# Patient Record
Sex: Female | Born: 1970 | Race: White | Hispanic: No | Smoking: Never smoker
Health system: Southern US, Community
[De-identification: ages and names within clinical notes are randomized; demographics above are authoritative.]

## PROBLEM LIST (undated history)

## (undated) DIAGNOSIS — G479 Sleep disorder, unspecified: Secondary | ICD-10-CM

## (undated) DIAGNOSIS — F418 Other specified anxiety disorders: Secondary | ICD-10-CM

## (undated) DIAGNOSIS — E559 Vitamin D deficiency, unspecified: Secondary | ICD-10-CM

## (undated) DIAGNOSIS — Z8489 Family history of other specified conditions: Secondary | ICD-10-CM

## (undated) DIAGNOSIS — K219 Gastro-esophageal reflux disease without esophagitis: Secondary | ICD-10-CM

## (undated) DIAGNOSIS — J069 Acute upper respiratory infection, unspecified: Secondary | ICD-10-CM

## (undated) DIAGNOSIS — J3489 Other specified disorders of nose and nasal sinuses: Secondary | ICD-10-CM

## (undated) DIAGNOSIS — W57XXXA Bitten or stung by nonvenomous insect and other nonvenomous arthropods, initial encounter: Secondary | ICD-10-CM

## (undated) DIAGNOSIS — R4589 Other symptoms and signs involving emotional state: Secondary | ICD-10-CM

## (undated) DIAGNOSIS — R002 Palpitations: Secondary | ICD-10-CM

## (undated) HISTORY — DX: Gastro-esophageal reflux disease without esophagitis: K21.9

## (undated) HISTORY — PX: COLONOSCOPY: SHX174

## (undated) HISTORY — DX: Sleep disorder, unspecified: G47.9

## (undated) HISTORY — DX: Other symptoms and signs involving emotional state: R45.89

## (undated) HISTORY — PX: WISDOM TOOTH EXTRACTION: SHX21

## (undated) HISTORY — DX: Bitten or stung by nonvenomous insect and other nonvenomous arthropods, initial encounter: W57.XXXA

## (undated) HISTORY — DX: Acute upper respiratory infection, unspecified: J06.9

## (undated) HISTORY — DX: Vitamin D deficiency, unspecified: E55.9

## (undated) HISTORY — DX: Other specified disorders of nose and nasal sinuses: J34.89

## (undated) HISTORY — DX: Palpitations: R00.2

## (undated) HISTORY — DX: Other specified anxiety disorders: F41.8

---

## 1999-01-07 ENCOUNTER — Inpatient Hospital Stay (HOSPITAL_COMMUNITY): Admission: AD | Admit: 1999-01-07 | Discharge: 1999-01-09 | Payer: Self-pay | Admitting: Obstetrics and Gynecology

## 2000-02-11 ENCOUNTER — Other Ambulatory Visit: Admission: RE | Admit: 2000-02-11 | Discharge: 2000-02-11 | Payer: Self-pay | Admitting: Obstetrics and Gynecology

## 2001-04-24 ENCOUNTER — Other Ambulatory Visit: Admission: RE | Admit: 2001-04-24 | Discharge: 2001-04-24 | Payer: Self-pay | Admitting: Obstetrics and Gynecology

## 2002-04-25 ENCOUNTER — Other Ambulatory Visit: Admission: RE | Admit: 2002-04-25 | Discharge: 2002-04-25 | Payer: Self-pay | Admitting: Obstetrics and Gynecology

## 2003-05-01 ENCOUNTER — Other Ambulatory Visit: Admission: RE | Admit: 2003-05-01 | Discharge: 2003-05-01 | Payer: Self-pay | Admitting: Obstetrics and Gynecology

## 2003-05-16 ENCOUNTER — Encounter: Admission: RE | Admit: 2003-05-16 | Discharge: 2003-05-16 | Payer: Self-pay | Admitting: Obstetrics and Gynecology

## 2005-08-25 ENCOUNTER — Encounter: Admission: RE | Admit: 2005-08-25 | Discharge: 2005-08-25 | Payer: Self-pay | Admitting: Gastroenterology

## 2008-07-24 ENCOUNTER — Encounter: Admission: RE | Admit: 2008-07-24 | Discharge: 2008-07-24 | Payer: Self-pay | Admitting: Obstetrics and Gynecology

## 2010-01-14 ENCOUNTER — Encounter: Admission: RE | Admit: 2010-01-14 | Discharge: 2010-01-14 | Payer: Self-pay | Admitting: Obstetrics and Gynecology

## 2010-08-09 ENCOUNTER — Encounter: Payer: Self-pay | Admitting: Gastroenterology

## 2011-01-08 ENCOUNTER — Other Ambulatory Visit: Payer: Self-pay | Admitting: Obstetrics and Gynecology

## 2011-01-08 DIAGNOSIS — Z1231 Encounter for screening mammogram for malignant neoplasm of breast: Secondary | ICD-10-CM

## 2011-01-19 ENCOUNTER — Ambulatory Visit
Admission: RE | Admit: 2011-01-19 | Discharge: 2011-01-19 | Disposition: A | Discharge status: Home / Self Care | Payer: BC Managed Care – PPO | Source: Ambulatory Visit | Attending: Obstetrics and Gynecology | Admitting: Obstetrics and Gynecology

## 2011-01-19 DIAGNOSIS — Z1231 Encounter for screening mammogram for malignant neoplasm of breast: Secondary | ICD-10-CM

## 2012-01-18 ENCOUNTER — Other Ambulatory Visit: Payer: Self-pay | Admitting: Obstetrics and Gynecology

## 2012-01-18 DIAGNOSIS — Z1231 Encounter for screening mammogram for malignant neoplasm of breast: Secondary | ICD-10-CM

## 2012-01-28 ENCOUNTER — Ambulatory Visit
Admission: RE | Admit: 2012-01-28 | Discharge: 2012-01-28 | Disposition: A | Discharge status: Home / Self Care | Payer: BC Managed Care – PPO | Source: Ambulatory Visit | Attending: Obstetrics and Gynecology | Admitting: Obstetrics and Gynecology

## 2012-01-28 DIAGNOSIS — Z1231 Encounter for screening mammogram for malignant neoplasm of breast: Secondary | ICD-10-CM

## 2012-12-21 ENCOUNTER — Other Ambulatory Visit: Payer: Self-pay

## 2012-12-21 DIAGNOSIS — Z1231 Encounter for screening mammogram for malignant neoplasm of breast: Secondary | ICD-10-CM

## 2013-01-30 ENCOUNTER — Ambulatory Visit
Admission: RE | Admit: 2013-01-30 | Discharge: 2013-01-30 | Disposition: A | Discharge status: Home / Self Care | Payer: BC Managed Care – PPO | Source: Ambulatory Visit

## 2013-01-30 DIAGNOSIS — Z1231 Encounter for screening mammogram for malignant neoplasm of breast: Secondary | ICD-10-CM

## 2013-03-16 ENCOUNTER — Telehealth: Payer: Self-pay | Admitting: Hematology & Oncology

## 2013-03-16 NOTE — Telephone Encounter (Signed)
Tiffany sent referral through Epic. She will fax records, they are not in Epic

## 2013-03-16 NOTE — Telephone Encounter (Signed)
Left pt message to call and schedule appointment °

## 2013-03-20 ENCOUNTER — Telehealth: Payer: Self-pay | Admitting: Hematology & Oncology

## 2013-03-20 NOTE — Telephone Encounter (Signed)
Left pt message to call for appointment °

## 2013-03-21 ENCOUNTER — Telehealth: Payer: Self-pay | Admitting: Hematology & Oncology

## 2013-03-21 NOTE — Telephone Encounter (Signed)
Left message for pt to call and schedule appointment °

## 2013-03-23 ENCOUNTER — Telehealth: Payer: Self-pay | Admitting: Hematology & Oncology

## 2013-03-23 NOTE — Telephone Encounter (Signed)
Pt called stated that Dr. Billy Coast is not her MD. Chart says Dr. Cherly Hensen. I left message with referring Sue Lush to call to clarify

## 2013-03-23 NOTE — Telephone Encounter (Signed)
Spoke with referring Sue Lush, she said pt needs to be seen at a different MD and will put in a new referral.

## 2013-12-05 DIAGNOSIS — W57XXXA Bitten or stung by nonvenomous insect and other nonvenomous arthropods, initial encounter: Secondary | ICD-10-CM

## 2013-12-05 HISTORY — DX: Bitten or stung by nonvenomous insect and other nonvenomous arthropods, initial encounter: W57.XXXA

## 2013-12-17 DIAGNOSIS — J069 Acute upper respiratory infection, unspecified: Secondary | ICD-10-CM

## 2013-12-17 HISTORY — DX: Acute upper respiratory infection, unspecified: J06.9

## 2014-01-10 ENCOUNTER — Other Ambulatory Visit: Payer: Self-pay

## 2014-01-10 DIAGNOSIS — Z1231 Encounter for screening mammogram for malignant neoplasm of breast: Secondary | ICD-10-CM

## 2014-01-31 ENCOUNTER — Ambulatory Visit
Admission: RE | Admit: 2014-01-31 | Discharge: 2014-01-31 | Disposition: A | Discharge status: Home / Self Care | Payer: BC Managed Care – PPO | Source: Ambulatory Visit

## 2014-01-31 DIAGNOSIS — Z1231 Encounter for screening mammogram for malignant neoplasm of breast: Secondary | ICD-10-CM

## 2014-02-01 ENCOUNTER — Other Ambulatory Visit: Payer: Self-pay | Admitting: Obstetrics and Gynecology

## 2014-02-01 DIAGNOSIS — R928 Other abnormal and inconclusive findings on diagnostic imaging of breast: Secondary | ICD-10-CM

## 2014-02-08 ENCOUNTER — Encounter (INDEPENDENT_AMBULATORY_CARE_PROVIDER_SITE_OTHER): Payer: Self-pay

## 2014-02-08 ENCOUNTER — Ambulatory Visit
Admission: RE | Admit: 2014-02-08 | Discharge: 2014-02-08 | Disposition: A | Discharge status: Home / Self Care | Payer: BC Managed Care – PPO | Source: Ambulatory Visit | Attending: Obstetrics and Gynecology | Admitting: Obstetrics and Gynecology

## 2014-02-08 ENCOUNTER — Other Ambulatory Visit: Payer: Self-pay | Admitting: Obstetrics and Gynecology

## 2014-02-08 DIAGNOSIS — R928 Other abnormal and inconclusive findings on diagnostic imaging of breast: Secondary | ICD-10-CM

## 2014-02-14 ENCOUNTER — Other Ambulatory Visit: Payer: Self-pay | Admitting: Obstetrics and Gynecology

## 2014-02-14 DIAGNOSIS — R928 Other abnormal and inconclusive findings on diagnostic imaging of breast: Secondary | ICD-10-CM

## 2014-02-18 ENCOUNTER — Ambulatory Visit
Admission: RE | Admit: 2014-02-18 | Discharge: 2014-02-18 | Disposition: A | Discharge status: Home / Self Care | Payer: BC Managed Care – PPO | Source: Ambulatory Visit | Attending: Obstetrics and Gynecology | Admitting: Obstetrics and Gynecology

## 2014-02-18 DIAGNOSIS — R928 Other abnormal and inconclusive findings on diagnostic imaging of breast: Secondary | ICD-10-CM

## 2014-02-18 HISTORY — PX: BREAST BIOPSY: SHX20

## 2014-05-20 ENCOUNTER — Encounter: Payer: Self-pay | Admitting: Internal Medicine

## 2014-05-20 ENCOUNTER — Ambulatory Visit (INDEPENDENT_AMBULATORY_CARE_PROVIDER_SITE_OTHER): Payer: BC Managed Care – PPO | Admitting: Internal Medicine

## 2014-05-20 VITALS — BP 112/76 | HR 112 | Ht 65.0 in | Wt 185.6 lb

## 2014-05-20 DIAGNOSIS — R002 Palpitations: Secondary | ICD-10-CM

## 2014-05-20 DIAGNOSIS — K219 Gastro-esophageal reflux disease without esophagitis: Secondary | ICD-10-CM

## 2014-05-20 NOTE — Patient Instructions (Signed)
Your physician recommends that you schedule a follow-up appointment in: 4-6 weeks with Dr Ladona Ridgelaylor  Your physician has recommended that you wear a holter monitor. Holter monitors are medical devices that record the heart's electrical activity. Doctors most often use these monitors to diagnose arrhythmias. Arrhythmias are problems with the speed or rhythm of the heartbeat. The monitor is a small, portable device. You can wear one while you do your normal daily activities. This is usually used to diagnose what is causing palpitations/syncope (passing out).

## 2014-05-22 ENCOUNTER — Encounter: Payer: Self-pay | Admitting: Internal Medicine

## 2014-05-22 DIAGNOSIS — K219 Gastro-esophageal reflux disease without esophagitis: Secondary | ICD-10-CM | POA: Insufficient documentation

## 2014-05-22 DIAGNOSIS — R002 Palpitations: Secondary | ICD-10-CM | POA: Insufficient documentation

## 2014-05-22 NOTE — Progress Notes (Signed)
      HPI [name] is referred today by Dr. Valentina LucksGriffin for evaluation of palpitations. She notes that over the past year and especially over the past 6 months she has had palpitations which occur sporadically and without obvious warning. She has associated anxiousness and feels tired. She feels like her heart is beating too fast. She has not had syncope. Her symptoms occur to some extent daily. She has not had chest pain. She does have GERD symptoms. She consumes 1-2 caffeine beverages a day. Denies ETOH intake. She feels like her heart is beating irregularly at times. No other symptoms.  Allergies  Allergen Reactions  . Cephalexin Other (See Comments)    Rash. Happened years ago, unsure of severity level  . Sulfa Antibiotics Other (See Comments)    UNKNOWN REACTION  . Neomycin Sulfate [Neomycin] Rash     Current Outpatient Prescriptions  Medication Sig Dispense Refill  . cholecalciferol (VITAMIN D) 1000 UNITS tablet Take 1,000 Units by mouth daily.    . metroNIDAZOLE (METROGEL) 1 % gel Apply 1 application topically daily.     No current facility-administered medications for this visit.     Past Medical History  Diagnosis Date  . Gastro-esophageal reflux disease without esophagitis     Occasionally  . Vitamin D deficiency   . Palpitations   . Anxiety about health   . Sinus pressure   . Tick bite 12/05/13    Back of neck  . Sleep disturbance     "snorts" at night, and has vivid dreams - she has gained some weight and worries about sleep apnea - she says her family has told her she doesn't have apneic episodes, and she says she's fatigued, but not sleepy.  Marland Kitchen. URI (upper respiratory infection) 12/2013    ROS:   All systems reviewed and negative except as noted in the HPI.   History reviewed. No pertinent past surgical history.   History reviewed. No pertinent family history.   History   Social History  . Marital Status: Divorced    Spouse Name: N/A    Number of  Children: N/A  . Years of Education: N/A   Occupational History  . Not on file.   Social History Main Topics  . Smoking status: Never Smoker   . Smokeless tobacco: Not on file  . Alcohol Use: No  . Drug Use: Not on file  . Sexual Activity: Not on file   Other Topics Concern  . Not on file   Social History Narrative     BP 112/76 mmHg  Pulse 112  Ht 5\' 5"  (1.651 m)  Wt 185 lb 9.6 oz (84.188 kg)  BMI 30.89 kg/m2  Physical Exam:  Well appearing 43 yo woman, NAD HEENT: Unremarkable Neck:  7 cm JVD, no thyromegally Back:  No CVA tenderness Lungs:  Clear with no wheezes HEART:  Regular rate rhythm, no murmurs, no rubs, no clicks Abd:  soft, positive bowel sounds, no organomegally, no rebound, no guarding Ext:  2 plus pulses, no edema, no cyanosis, no clubbing Skin:  No rashes no nodules Neuro:  CN II through XII intact, motor grossly intact  EKG - sinus tachycardia   Assess/Plan:

## 2014-05-22 NOTE — Assessment & Plan Note (Signed)
The etiology of her symptoms is unclear. She may have sinus tachycardia or even SVT or atrial fib. I suspect she has PVCs/PACs and possibly inappropriate sinus tachycardia. We discussed the possible etiologies of her symptoms. I have recommended she wear a cardiac monitor for 48 hours. Depending on what is seen will determine what if any additional testing is indicated. I have tried to reassure her about the most likely benign nature of her palpitations. Also we discussed exercise as a way to relieve stress and anxiety.

## 2014-05-22 NOTE — Assessment & Plan Note (Signed)
She is on no meds for this. If it worsens, she might consider some type of acid blockade.

## 2014-05-23 ENCOUNTER — Encounter (INDEPENDENT_AMBULATORY_CARE_PROVIDER_SITE_OTHER): Payer: BC Managed Care – PPO

## 2014-05-23 ENCOUNTER — Encounter: Payer: Self-pay | Admitting: *Deleted

## 2014-05-23 DIAGNOSIS — R002 Palpitations: Secondary | ICD-10-CM

## 2014-05-23 NOTE — Progress Notes (Signed)
Patient ID: Evelyn Lewis F [place], female   DOB: 08/13/1970, 43 y.o.   MRN: [id number] [facility] 48 hour holter monitor applied to patient.

## 2014-06-18 ENCOUNTER — Encounter: Payer: Self-pay | Admitting: Internal Medicine

## 2014-06-18 ENCOUNTER — Ambulatory Visit (INDEPENDENT_AMBULATORY_CARE_PROVIDER_SITE_OTHER): Payer: BC Managed Care – PPO | Admitting: Internal Medicine

## 2014-06-18 VITALS — BP 122/77 | HR 98 | Ht 65.0 in | Wt 188.8 lb

## 2014-06-18 DIAGNOSIS — E785 Hyperlipidemia, unspecified: Secondary | ICD-10-CM | POA: Insufficient documentation

## 2014-06-18 DIAGNOSIS — R002 Palpitations: Secondary | ICD-10-CM

## 2014-06-18 NOTE — Assessment & Plan Note (Signed)
Her LDL and total cholesterol are elevated, but her HDL is also elevated. Because she has no family history of coronary disease, and no significant cardiac risk factors, I recommended that she work on dietary control increase in her physical activity, and maintaining a low-fat diet. She is encouraged to lose weight.

## 2014-06-18 NOTE — Patient Instructions (Signed)
Your physician wants you to follow-up in: 12 months with Dr. Taylor. You will receive a reminder letter in the mail two months in advance. If you don't receive a letter, please call our office to schedule the follow-up appointment.    

## 2014-06-18 NOTE — Assessment & Plan Note (Signed)
The patient has benign palpitations. The mechanism appears to be a combination of premature atrial contractions and premature ventricular contractions. I discussed the benign nature of her symptoms. We discussed the importance of maintaining a low caffeine and low alcohol diet. She will undergo watchful waiting. If her palpitations worsen, antiarrhythmic drug therapy would be a consideration, although reassurance appears to be all that we need to do at this point.

## 2014-06-18 NOTE — Progress Notes (Signed)
      HPI [name] returns today for ongoing evaluation of palpitations. She was seen by me several weeks ago with palpitations and a cardiac monitor was ordered. She was found to have rare premature atrial and ventricular contractions. She was also noted to have sinus tachycardia. She notes that the stress that she was experiencing at work has improved. Some days her palpitations are worse than others. She has tried to moderate her caffeine intake. The patient has been found to have dyslipidemia, of questionable clinical significance. There is no premature history of coronary artery disease. Otherwise, she has no symptoms.  Allergies  Allergen Reactions  . Cephalexin Other (See Comments)    Rash. Happened years ago, unsure of severity level  . Sulfa Antibiotics Other (See Comments)    UNKNOWN REACTION  . Neomycin Sulfate [Neomycin] Rash     Current Outpatient Prescriptions  Medication Sig Dispense Refill  . cholecalciferol (VITAMIN D) 1000 UNITS tablet Take 1,000 Units by mouth daily.    . metroNIDAZOLE (METROGEL) 1 % gel Apply 1 application topically daily.     No current facility-administered medications for this visit.     Past Medical History  Diagnosis Date  . Gastro-esophageal reflux disease without esophagitis     Occasionally  . Vitamin D deficiency   . Palpitations   . Anxiety about health   . Sinus pressure   . Tick bite 12/05/13    Back of neck  . Sleep disturbance     "snorts" at night, and has vivid dreams - she has gained some weight and worries about sleep apnea - she says her family has told her she doesn't have apneic episodes, and she says she's fatigued, but not sleepy.  Marland Kitchen. URI (upper respiratory infection) 12/2013    ROS:   All systems reviewed and negative except as noted in the HPI.   No past surgical history on file.   No family history on file.   History   Social History  . Marital Status: Divorced    Spouse Name: N/A    Number of  Children: N/A  . Years of Education: N/A   Occupational History  . Not on file.   Social History Main Topics  . Smoking status: Never Smoker   . Smokeless tobacco: Not on file  . Alcohol Use: No  . Drug Use: Not on file  . Sexual Activity: Not on file   Other Topics Concern  . Not on file   Social History Narrative     BP 122/77 mmHg  Pulse 98  Ht 5\' 5"  (1.651 m)  Wt 188 lb 12.8 oz (85.639 kg)  BMI 31.42 kg/m2  Physical Exam:  Well appearing 43 yo woman, NAD HEENT: Unremarkable Neck:  7 cm JVD, no thyromegally Back:  No CVA tenderness Lungs:  Clear with no wheezes HEART:  Regular rate rhythm, no murmurs, no rubs, no clicks Abd:  soft, positive bowel sounds, no organomegally, no rebound, no guarding Ext:  2 plus pulses, no edema, no cyanosis, no clubbing Skin:  No rashes no nodules Neuro:  CN II through XII intact, motor grossly intact  Cardiac monitor - reviewed, demonstrating sinus rhythm, sinus tachycardia, rare PACs, and PVCs.   Assess/Plan:

## 2014-12-30 ENCOUNTER — Other Ambulatory Visit: Payer: Self-pay

## 2014-12-30 DIAGNOSIS — Z1231 Encounter for screening mammogram for malignant neoplasm of breast: Secondary | ICD-10-CM

## 2015-02-04 ENCOUNTER — Ambulatory Visit
Admission: RE | Admit: 2015-02-04 | Discharge: 2015-02-04 | Disposition: A | Discharge status: Home / Self Care | Payer: BC Managed Care – PPO | Source: Ambulatory Visit

## 2015-02-04 DIAGNOSIS — Z1231 Encounter for screening mammogram for malignant neoplasm of breast: Secondary | ICD-10-CM

## 2015-02-07 ENCOUNTER — Other Ambulatory Visit: Payer: Self-pay | Admitting: Obstetrics and Gynecology

## 2015-02-07 DIAGNOSIS — R928 Other abnormal and inconclusive findings on diagnostic imaging of breast: Secondary | ICD-10-CM

## 2015-02-12 ENCOUNTER — Ambulatory Visit
Admission: RE | Admit: 2015-02-12 | Discharge: 2015-02-12 | Disposition: A | Discharge status: Home / Self Care | Payer: BC Managed Care – PPO | Source: Ambulatory Visit | Attending: Obstetrics and Gynecology | Admitting: Obstetrics and Gynecology

## 2015-02-12 DIAGNOSIS — R928 Other abnormal and inconclusive findings on diagnostic imaging of breast: Secondary | ICD-10-CM

## 2016-02-06 ENCOUNTER — Other Ambulatory Visit: Payer: Self-pay | Admitting: Obstetrics and Gynecology

## 2016-02-06 DIAGNOSIS — Z1231 Encounter for screening mammogram for malignant neoplasm of breast: Secondary | ICD-10-CM

## 2016-03-02 ENCOUNTER — Ambulatory Visit
Admission: RE | Admit: 2016-03-02 | Discharge: 2016-03-02 | Disposition: A | Discharge status: Home / Self Care | Payer: BC Managed Care – PPO | Source: Ambulatory Visit | Attending: Obstetrics and Gynecology | Admitting: Obstetrics and Gynecology

## 2016-03-02 DIAGNOSIS — Z1231 Encounter for screening mammogram for malignant neoplasm of breast: Secondary | ICD-10-CM

## 2016-03-03 ENCOUNTER — Other Ambulatory Visit: Payer: Self-pay | Admitting: Obstetrics and Gynecology

## 2016-03-03 DIAGNOSIS — R928 Other abnormal and inconclusive findings on diagnostic imaging of breast: Secondary | ICD-10-CM

## 2016-03-11 ENCOUNTER — Other Ambulatory Visit: Payer: BC Managed Care – PPO

## 2016-03-15 ENCOUNTER — Other Ambulatory Visit: Payer: Self-pay | Admitting: Family Medicine

## 2016-03-15 DIAGNOSIS — R928 Other abnormal and inconclusive findings on diagnostic imaging of breast: Secondary | ICD-10-CM

## 2016-03-18 ENCOUNTER — Ambulatory Visit
Admission: RE | Admit: 2016-03-18 | Discharge: 2016-03-18 | Disposition: A | Discharge status: Home / Self Care | Payer: BC Managed Care – PPO | Source: Ambulatory Visit | Attending: Obstetrics and Gynecology | Admitting: Obstetrics and Gynecology

## 2016-03-18 DIAGNOSIS — R928 Other abnormal and inconclusive findings on diagnostic imaging of breast: Secondary | ICD-10-CM

## 2016-12-21 ENCOUNTER — Encounter: Payer: Self-pay | Admitting: Physician Assistant

## 2016-12-24 ENCOUNTER — Encounter: Payer: Self-pay | Admitting: Physician Assistant

## 2016-12-24 ENCOUNTER — Ambulatory Visit (INDEPENDENT_AMBULATORY_CARE_PROVIDER_SITE_OTHER): Payer: BC Managed Care – PPO | Admitting: Physician Assistant

## 2016-12-24 VITALS — BP 132/80 | HR 98 | Ht 65.0 in | Wt 184.6 lb

## 2016-12-24 DIAGNOSIS — R002 Palpitations: Secondary | ICD-10-CM | POA: Diagnosis not present

## 2016-12-24 NOTE — Patient Instructions (Signed)
Medication Instructions:  Your physician recommends that you continue on your current medications as directed. Please refer to the Current Medication list given to you today.   Labwork: None Ordered   Testing/Procedures: Your physician has requested that you have an echocardiogram. Echocardiography is a painless test that uses sound waves to create images of your heart. It provides your doctor with information about the size and shape of your heart and how well your heart's chambers and valves are working. This procedure takes approximately one hour. There are no restrictions for this procedure.  Your physician has recommended that you wear an event monitor. Event monitors are medical devices that record the heart's electrical activity. Doctors most often us these monitors to diagnose arrhythmias. Arrhythmias are problems with the speed or rhythm of the heartbeat. The monitor is a small, portable device. You can wear one while you do your normal daily activities. This is usually used to diagnose what is causing palpitations/syncope (passing out).    Follow-Up: Your physician recommends that you schedule a follow-up appointment in: 6 weeks with Francis Dowseenee Ursuy PA  Any Other Special Instructions Will Be Listed Below (If Applicable).     If you need a refill on your cardiac medications before your next appointment, please call your pharmacy.  Thank you for choosing [facility]  Corrine CMA AAMA

## 2016-12-24 NOTE — Progress Notes (Signed)
Cardiology Office Note Date:  12/24/2016  Patient ID:  Evelyn Lewis, DOB 12/01/1970, MRN [id number] PCP:  Maurice SmallGriffin, Elaine, MD  Cardiologist:  Dr. Ladona Ridgelaylor, last in 2015   Chief Complaint: palpitations  History of Present Illness: Evelyn Lewis is a 46 y.o. female with history of GERD, anxiety, ?apnea  In her history but she denies snoring, waking in the night or daytime sleepiness.  She comes in today to be seen for Dr. Ladona Ridgelaylor, last seen by him in 2015, at that time his palpitations w/u on monitoring noting PVCs/PACs felt benign and advised life style modificataions  She comes in today with recurrent palpitations.  In the last couple years since her last visit she has felt well, infrequent palpitations, skipped beats that were not worrisome or bothersome to her.  4 weeks ago after a couple days of GI illness/diarrhea she felt like she was having on/off palpitations most of the day but attributed it not feeling well, they persisted and felt like it may have been her menses, but despite things normalizing they continue pretty persistently.  She denies any new medical issues or changes in her medical status, she continues with regular monthly menses.  While there are things going on in her life she does not feel like she is particularly or overly stress about them.  She has not had any medication changes prescribed or OTC.  She says they are feelings of skipped, beats, missing beats, sometimes a fluttering, sometimes make he feel pressure in her throat.  They are brief but recurrent multiple times a day most days.  She doesn't notice them as much when active or on her walks for exercise, no dizziness, near syncope or syncope, no SOB or changes in her exertional capacity.  They are very anxiety provoking for her.  Past Medical History:  Diagnosis Date  . Anxiety about health   . Gastro-esophageal reflux disease without esophagitis    Occasionally  . Palpitations   . Sinus pressure   . Sleep  disturbance    "snorts" at night, and has vivid dreams - she has gained some weight and worries about sleep apnea - she says her family has told her she doesn't have apneic episodes, and she says she's fatigued, but not sleepy.  . Tick bite 12/05/13   Back of neck  . URI (upper respiratory infection) 12/2013  . Vitamin D deficiency     No past surgical history on file.  Current Outpatient Prescriptions  Medication Sig Dispense Refill  . cholecalciferol (VITAMIN D) 1000 UNITS tablet Take 1,000 Units by mouth daily.    . metroNIDAZOLE (METROGEL) 1 % gel Apply 1 application topically daily.     No current facility-administered medications for this visit.     Allergies:   Cephalexin; Sulfa antibiotics; and Neomycin sulfate [neomycin]   Social History:  The patient  reports that she has never smoked. She has never used smokeless tobacco. She reports that she does not drink alcohol or use drugs.   Family History:  The patient's family history includes Colon cancer in her paternal grandfather; Heart attack in her maternal grandfather.  ROS:  Please see the history of present illness.    All other systems are reviewed and otherwise negative.   PHYSICAL EXAM: VS:  BP 132/80 (BP Location: Left Arm)   Pulse 98   Ht 5\' 5"  (1.651 m)   Wt 184 lb 9.6 oz (83.7 kg)   BMI 30.72 kg/m  BMI: Body mass index  is 30.72 kg/m. Well nourished, well developed, in no acute distress  HEENT: normocephalic, atraumatic  Neck: no JVD, carotid bruits or masses Cardiac:  RRR; no significant murmurs, no rubs, or gallops Lungs:  CTA b/l, no wheezing, rhonchi or rales  Abd: soft, nontender, obese MS: no deformity or atrophy Ext: no edema  Skin: warm and dry, no rash Neuro:  No gross deficits appreciated Psych: euthymic mood, full affect   EKG:  Done today and reviewed by myself: SR 98bpm, LAD  Recent Labs: No results found for requested labs within last 8760 hours.  No results found for requested labs  within last 8760 hours.   CrCl cannot be calculated (No order found.).   Wt Readings from Last 3 Encounters:  12/24/16 184 lb 9.6 oz (83.7 kg)  06/18/14 188 lb 12.8 oz (85.6 kg)  05/20/14 185 lb 9.6 oz (84.2 kg)     Other studies reviewed: Additional studies/records reviewed today include: summarized above  ASSESSMENT AND PLAN:  1. Palpitations     They feel in some ways similar to her palpitations historically that she knows are benig, but there is a component as well that feel different to her.  We discussed importance of regular exercise, weight loss, staying adequately hydrated, minimizing stimulants/caffeine and stress reduction/management.  Disposition: F/u with an echo doppler, 1 week EM and f/u in 6 weeks, sooner if needed.  If normal results and they resolve with lifestyle modifications, can push out or change to PRN.  Current medicines are reviewed at length with the patient today.  The patient did not have any concerns regarding medicines.  Judith Blonder, PA-C 12/24/2016 4:12 PM     [facility] [address] [place] [address] [phone] (office)  [phone] (fax)

## 2017-01-06 ENCOUNTER — Other Ambulatory Visit: Payer: Self-pay

## 2017-01-06 ENCOUNTER — Ambulatory Visit (HOSPITAL_COMMUNITY): Payer: BC Managed Care – PPO | Attending: Cardiovascular Disease

## 2017-01-06 ENCOUNTER — Ambulatory Visit (INDEPENDENT_AMBULATORY_CARE_PROVIDER_SITE_OTHER): Payer: BC Managed Care – PPO

## 2017-01-06 DIAGNOSIS — R002 Palpitations: Secondary | ICD-10-CM | POA: Diagnosis not present

## 2017-02-07 NOTE — Progress Notes (Signed)
Cardiology Office Note Date:  02/07/2017  Patient ID:  Evelyn Lewis, DOB 10/29/1970, MRN [id number] PCP:  Maurice SmallGriffin, Elaine, MD  Cardiologist:  Dr. Ladona Ridgelaylor, last in 2015   Chief Complaint: palpitations  History of Present Illness: Evelyn Lewis is a 46 y.o. female with history of GERD, anxiety, ?apnea  In her history but she denies snoring, waking in the night or daytime sleepiness.  She comes in today to be seen for Dr. Ladona Ridgelaylor, last seen by him in 2015, at that time his palpitations w/u on monitoring noting PVCs/PACs felt benign and advised life style modificataions  She was most recently seen by myself 12/24/16 with recurrent palpitations.  She reported in the last couple years since her last visit she has felt well, infrequent palpitations, skipped beats that were not worrisome or bothersome to her.  4 weeks ago after a couple days of GI illness/diarrhea she felt like she was having on/off palpitations most of the day but attributed it not feeling well, they persisted and felt like it may have been her menses, but despite things normalizing they continue pretty persistently.  She denies any new medical issues or changes in her medical status, she continues with regular monthly menses.  While there are things going on in her life she does not feel like she is particularly or overly stress about them.  She has not had any medication changes prescribed or OTC.  She described  feelings of skipped, beats, missing beats, sometimes a fluttering, sometimes make he feel pressure in her throat.  They are brief but recurrent multiple times a day most days.  She doesn't notice them as much when active or on her walks for exercise, no dizziness, near syncope or syncope, no SOB or changes in her exertional capacity.  They are very anxiety provoking for her.  She was planned for an echo and 1 week monitoring.  Her echo was normal with LVEF 55%, no VHD, her monitor was reviewed by myself, and no arrhythmias are  noted.  Pt events are associated with PACs, perhaps a brief AT though artifact limits.  She is feeling better, her palpitations have improved, she is a Engineer, siteschool teacher and off the summer, noting the end of the year always very hectic, her youngest was graduating and had a lot going on.  This has all settled down.   She is feeling well, no CP or SOB, no dizziness, near syncope or syncope.  Past Medical History:  Diagnosis Date  . Anxiety about health   . Gastro-esophageal reflux disease without esophagitis    Occasionally  . Palpitations   . Sinus pressure   . Sleep disturbance    "snorts" at night, and has vivid dreams - she has gained some weight and worries about sleep apnea - she says her family has told her she doesn't have apneic episodes, and she says she's fatigued, but not sleepy.  . Tick bite 12/05/13   Back of neck  . URI (upper respiratory infection) 12/2013  . Vitamin D deficiency     No past surgical history on file.  Current Outpatient Prescriptions  Medication Sig Dispense Refill  . cholecalciferol (VITAMIN D) 1000 UNITS tablet Take 1,000 Units by mouth daily.    . metroNIDAZOLE (METROGEL) 1 % gel Apply 1 application topically daily.     No current facility-administered medications for this visit.     Allergies:   Cephalexin; Sulfa antibiotics; and Neomycin sulfate [neomycin]   Social History:  The  patient  reports that she has never smoked. She has never used smokeless tobacco. She reports that she does not drink alcohol or use drugs.   Family History:  The patient's family history includes Colon cancer in her paternal grandfather; Heart attack in her maternal grandfather.  ROS:  Please see the history of present illness.    All other systems are reviewed and otherwise negative.   PHYSICAL EXAM: VS:  There were no vitals taken for this visit. BMI: There is no height or weight on file to calculate BMI. Well nourished, well developed, in no acute distress  HEENT:  normocephalic, atraumatic  Neck: no JVD, carotid bruits or masses Cardiac:  RRR; no significant murmurs, no rubs, or gallops Lungs:  CTA b/l, no wheezing, rhonchi or rales  Abd: soft, nontender, obese MS: no deformity or atrophy Ext:  no edema  Skin: warm and dry, no rash Neuro:  No gross deficits appreciated Psych: euthymic mood, full affect   EKG:  Done 12/24/16  SR 98bpm, LAD  01/06/17: TTE Study Conclusions - Left ventricle: The cavity size was normal. Wall thickness was   normal. The estimated ejection fraction was 55%. Wall motion was   normal; there were no regional wall motion abnormalities. Doppler   parameters are consistent with abnormal left ventricular   relaxation (grade 1 diastolic dysfunction). - Aortic valve: There was no stenosis. - Mitral valve: There was no regurgitation. - Right ventricle: The cavity size was normal. Systolic function   was normal. - Pulmonary arteries: No complete TR doppler jet so unable to   estimate PA systolic pressure. - Inferior vena cava: The vessel was normal in size. The   respirophasic diameter changes were in the normal range (>= 50%),   consistent with normal central venous pressure. Impressions: - Normal LV size with EF 55%. Normal RV size and systolic function.   No significant valvular abnormalities.  Recent Labs: No results found for requested labs within last 8760 hours.  No results found for requested labs within last 8760 hours.   CrCl cannot be calculated (No order found.).   Wt Readings from Last 3 Encounters:  12/24/16 184 lb 9.6 oz (83.7 kg)  06/18/14 188 lb 12.8 oz (85.6 kg)  05/20/14 185 lb 9.6 oz (84.2 kg)     Other studies reviewed: Additional studies/records reviewed today include: summarized above  ASSESSMENT AND PLAN:  1. Palpitations     Have stettled down, feeling well     We re-discussed importance of regular exercise, weight loss (she has lost 3 pounds!), staying adequately hydrated, minimizing  stimulants/caffeine and stress reduction/management.  Disposition: We will see her back in 1 year/PRN   Current medicines are reviewed at length with the patient today.  The patient did not have any concerns regarding medicines.  Judith Blonder, PA-C 02/07/2017 4:33 PM     [facility] [address] [place] [address] [phone] (office)  [phone] (fax)

## 2017-02-08 ENCOUNTER — Ambulatory Visit (INDEPENDENT_AMBULATORY_CARE_PROVIDER_SITE_OTHER): Payer: BC Managed Care – PPO | Admitting: Physician Assistant

## 2017-02-08 ENCOUNTER — Encounter (INDEPENDENT_AMBULATORY_CARE_PROVIDER_SITE_OTHER): Payer: Self-pay

## 2017-02-08 VITALS — BP 128/72 | HR 98 | Ht 65.0 in | Wt 181.0 lb

## 2017-02-08 DIAGNOSIS — R002 Palpitations: Secondary | ICD-10-CM | POA: Diagnosis not present

## 2017-02-08 DIAGNOSIS — I491 Atrial premature depolarization: Secondary | ICD-10-CM | POA: Diagnosis not present

## 2017-02-08 NOTE — Patient Instructions (Signed)

## 2017-02-10 ENCOUNTER — Telehealth: Payer: Self-pay | Admitting: Physician Assistant

## 2017-02-10 NOTE — Telephone Encounter (Signed)
New Message ° ° pt verbalized that she is returning call for rn °

## 2020-04-21 ENCOUNTER — Other Ambulatory Visit: Payer: Self-pay | Admitting: Obstetrics and Gynecology

## 2020-04-21 DIAGNOSIS — Z1231 Encounter for screening mammogram for malignant neoplasm of breast: Secondary | ICD-10-CM

## 2020-05-12 ENCOUNTER — Other Ambulatory Visit: Payer: Self-pay

## 2020-05-12 ENCOUNTER — Ambulatory Visit
Admission: RE | Admit: 2020-05-12 | Discharge: 2020-05-12 | Disposition: A | Discharge status: Home / Self Care | Payer: BC Managed Care – PPO | Source: Ambulatory Visit | Attending: Obstetrics and Gynecology | Admitting: Obstetrics and Gynecology

## 2020-05-12 DIAGNOSIS — Z1231 Encounter for screening mammogram for malignant neoplasm of breast: Secondary | ICD-10-CM

## 2020-10-30 ENCOUNTER — Ambulatory Visit: Payer: BC Managed Care – PPO | Admitting: Internal Medicine

## 2020-10-30 ENCOUNTER — Encounter: Payer: Self-pay | Admitting: Internal Medicine

## 2020-10-30 ENCOUNTER — Other Ambulatory Visit: Payer: Self-pay

## 2020-10-30 VITALS — BP 116/78 | HR 104 | Ht 65.0 in | Wt 181.0 lb

## 2020-10-30 DIAGNOSIS — R002 Palpitations: Secondary | ICD-10-CM | POA: Diagnosis not present

## 2020-10-30 NOTE — Patient Instructions (Signed)

## 2020-10-30 NOTE — Progress Notes (Signed)
HPI Ms. Griffith returns today after a 7 year absence from our EP clinic. She has a h/o palpitations found to be PVC's and PAC's. She has not had syncope. She has occasional symptoms. She admits to dietary indiscretion. She denies chest pain. No sob.  Allergies  Allergen Reactions  . Cephalexin Other (See Comments)    Rash. Happened years ago, unsure of severity level  . Sulfa Antibiotics Other (See Comments)    UNKNOWN REACTION  . Neomycin Sulfate [Neomycin] Rash     Current Outpatient Medications  Medication Sig Dispense Refill  . cholecalciferol (VITAMIN D) 1000 UNITS tablet Take 1,000 Units by mouth daily.    . famotidine (PEPCID) 40 MG tablet Take 40 mg by mouth as needed for heartburn.    . metroNIDAZOLE (METROGEL) 1 % gel Apply 1 application topically daily.     No current facility-administered medications for this visit.     Past Medical History:  Diagnosis Date  . Anxiety about health   . Gastro-esophageal reflux disease without esophagitis    Occasionally  . Palpitations   . Sinus pressure   . Sleep disturbance    "snorts" at night, and has vivid dreams - she has gained some weight and worries about sleep apnea - she says her family has told her she doesn't have apneic episodes, and she says she's fatigued, but not sleepy.  . Tick bite 12/05/13   Back of neck  . URI (upper respiratory infection) 12/2013  . Vitamin D deficiency     ROS:   All systems reviewed and negative except as noted in the HPI.   History reviewed. No pertinent surgical history.   Family History  Problem Relation Age of Onset  . Heart attack Maternal Grandfather   . Colon cancer Paternal Grandfather      Social History   Socioeconomic History  . Marital status: Divorced    Spouse name: Not on file  . Number of children: Not on file  . Years of education: Not on file  . Highest education level: Not on file  Occupational History  . Not on file  Tobacco Use  . Smoking  status: Never Smoker  . Smokeless tobacco: Never Used  Vaping Use  . Vaping Use: Never used  Substance and Sexual Activity  . Alcohol use: No  . Drug use: No  . Sexual activity: Not on file  Other Topics Concern  . Not on file  Social History Narrative  . Not on file   Social Determinants of Health   Financial Resource Strain: Not on file  Food Insecurity: Not on file  Transportation Needs: Not on file  Physical Activity: Not on file  Stress: Not on file  Social Connections: Not on file  Intimate Partner Violence: Not on file     BP 116/78   Pulse (!) 104   Ht 5\' 5"  (1.651 m)   Wt 181 lb (82.1 kg)   SpO2 96%   BMI 30.12 kg/m   Physical Exam:  Well appearing NAD HEENT: Unremarkable Neck:  No JVD, no thyromegally Lymphatics:  No adenopathy Back:  No CVA tenderness Lungs:  Clear with no wheezes HEART:  Regular rate rhythm, no murmurs, no rubs, no clicks Abd:  soft, positive bowel sounds, no organomegally, no rebound, no guarding Ext:  2 plus pulses, no edema, no cyanosis, no clubbing Skin:  No rashes no nodules Neuro:  CN II through XII intact, motor grossly intact  EKG -  sinus tachy   Assess/Plan: 1. Palpitations - we discussed the benign nature of her symptoms. We discussed the mechanism of the PAC's and PVC's. I encouraged her to avoid caffeine and ETOH. We discussed medical therapy if the symptoms worsen. She will undergo watchful waiting. 2. Obesity - we discussed intermittent fasting.    Sharlot Gowda Jacquelinne Speak,MD

## 2021-01-06 ENCOUNTER — Other Ambulatory Visit: Payer: Self-pay

## 2021-01-06 ENCOUNTER — Emergency Department (HOSPITAL_COMMUNITY)
Admission: EM | Admit: 2021-01-06 | Discharge: 2021-01-07 | Disposition: A | Discharge status: Home / Self Care | Payer: BC Managed Care – PPO | Attending: Emergency Medicine | Admitting: Emergency Medicine

## 2021-01-06 ENCOUNTER — Encounter (HOSPITAL_COMMUNITY): Payer: Self-pay

## 2021-01-06 DIAGNOSIS — R002 Palpitations: Secondary | ICD-10-CM | POA: Insufficient documentation

## 2021-01-06 DIAGNOSIS — R110 Nausea: Secondary | ICD-10-CM | POA: Diagnosis not present

## 2021-01-06 DIAGNOSIS — K449 Diaphragmatic hernia without obstruction or gangrene: Secondary | ICD-10-CM | POA: Diagnosis not present

## 2021-01-06 DIAGNOSIS — R000 Tachycardia, unspecified: Secondary | ICD-10-CM | POA: Diagnosis not present

## 2021-01-06 NOTE — ED Triage Notes (Signed)
Pt coming from home c/o of her heart racing. Pt was sitting in her recliner and she noted her HR to be 150 on home pulse ox. Pt begin to have some nausea and felt clammy.   EMS vitals: 120-130HR ST 138/76  18G LAC --> NS Bolus Given  Repeat Vitals: 106-112HR ST   104/69 BP

## 2021-01-07 ENCOUNTER — Emergency Department (HOSPITAL_COMMUNITY): Payer: BC Managed Care – PPO

## 2021-01-07 LAB — BASIC METABOLIC PANEL
Anion gap: 8 (ref 5–15)
BUN: 12 mg/dL (ref 6–20)
CO2: 22 mmol/L (ref 22–32)
Calcium: 9.3 mg/dL (ref 8.9–10.3)
Chloride: 106 mmol/L (ref 98–111)
Creatinine, Ser: 0.88 mg/dL (ref 0.44–1.00)
GFR, Estimated: >60 mL/min (ref >60)
Glucose, Bld: 124 mg/dL — ABNORMAL HIGH (ref 70–99)
Potassium: 3.9 mmol/L (ref 3.5–5.1)
Sodium: 136 mmol/L (ref 135–145)

## 2021-01-07 LAB — HEPATIC FUNCTION PANEL
ALT: 16 U/L (ref 0–44)
AST: 15 U/L (ref 15–41)
Albumin: 3.6 g/dL (ref 3.5–5.0)
Alkaline Phosphatase: 45 U/L (ref 38–126)
Bilirubin, Direct: 0.1 mg/dL (ref 0.0–0.2)
Indirect Bilirubin: 0.6 mg/dL (ref 0.3–0.9)
Total Bilirubin: 0.7 mg/dL (ref 0.3–1.2)
Total Protein: 6.7 g/dL (ref 6.5–8.1)

## 2021-01-07 LAB — CBC WITH DIFFERENTIAL/PLATELET
Abs Immature Granulocytes: 0.02 10*3/uL (ref 0.00–0.07)
Basophils Absolute: 0 10*3/uL (ref 0.0–0.1)
Basophils Relative: 0 %
Eosinophils Absolute: 0.1 10*3/uL (ref 0.0–0.5)
Eosinophils Relative: 1 %
HCT: 42 % (ref 36.0–46.0)
Hemoglobin: 13.8 g/dL (ref 12.0–15.0)
Immature Granulocytes: 0 %
Lymphocytes Relative: 20 %
Lymphs Abs: 1.8 10*3/uL (ref 0.7–4.0)
MCH: 28 pg (ref 26.0–34.0)
MCHC: 32.9 g/dL (ref 30.0–36.0)
MCV: 85.2 fL (ref 80.0–100.0)
Monocytes Absolute: 0.9 10*3/uL (ref 0.1–1.0)
Monocytes Relative: 9 %
Neutro Abs: 6.2 10*3/uL (ref 1.7–7.7)
Neutrophils Relative %: 70 %
Platelets: 218 10*3/uL (ref 150–400)
RBC: 4.93 MIL/uL (ref 3.87–5.11)
RDW: 13.4 % (ref 11.5–15.5)
WBC: 9.1 10*3/uL (ref 4.0–10.5)
nRBC: 0 % (ref 0.0–0.2)

## 2021-01-07 LAB — MAGNESIUM: Magnesium: 1.8 mg/dL (ref 1.7–2.4)

## 2021-01-07 LAB — TROPONIN I (HIGH SENSITIVITY)
Troponin I (High Sensitivity): 11 ng/L (ref <18)
Troponin I (High Sensitivity): 18 ng/L — ABNORMAL HIGH (ref <18)
Troponin I (High Sensitivity): 12 ng/L (ref <18)

## 2021-01-07 LAB — D-DIMER, QUANTITATIVE: D-Dimer, Quant: 1.62 ug/mL-FEU — ABNORMAL HIGH (ref 0.00–0.50)

## 2021-01-07 LAB — LIPASE, BLOOD: Lipase: 57 U/L — ABNORMAL HIGH (ref 11–51)

## 2021-01-07 LAB — TSH: TSH: 4.05 u[IU]/mL (ref 0.350–4.500)

## 2021-01-07 MED ORDER — LORAZEPAM 1 MG PO TABS
1.0000 mg | ORAL_TABLET | Freq: Once | ORAL | Status: AC
  Administered 2021-01-07: 1 mg via ORAL
  Filled 2021-01-07: qty 1

## 2021-01-07 MED ORDER — IOHEXOL 350 MG/ML SOLN
75.0000 mL | Freq: Once | INTRAVENOUS | Status: AC | PRN
  Administered 2021-01-07: 75 mL via INTRAVENOUS

## 2021-01-07 NOTE — ED Provider Notes (Signed)
[facility] EMERGENCY DEPARTMENT Provider Note   CSN: [id number] Arrival date & time: 01/06/21  2346     History No chief complaint on file.   Kenya Kook is a 50 y.o. female.  HPI     This 50 year old female with a history of GERD and anxiety who presents with palpitations.  Patient reports that she was sitting in her recliner when she had sudden onset of palpitations.  She noted her heart rate to be in the 150s to 160s.  She got increasingly anxious about her symptoms and her heart rate did not improve.  She did not have any shortness of breath or chest pain.  No known history of blood clots.  Patient states in general over the last several weeks she has had increasing nausea.  She has not noted that it has been related to food or exertion.  She denies any dietary indiscretion, increased caffeine use, alcohol, or drug use.  She has a history of palpitations and has been seen by cardiology in the past with a reassuring work-up and Holter monitoring.   Past Medical History:  Diagnosis Date   Anxiety about health    Gastro-esophageal reflux disease without esophagitis    Occasionally   Palpitations    Sinus pressure    Sleep disturbance    "snorts" at night, and has vivid dreams - she has gained some weight and worries about sleep apnea - she says her family has told her she doesn't have apneic episodes, and she says she's fatigued, but not sleepy.   Tick bite 12/05/13   Back of neck   URI (upper respiratory infection) 12/2013   Vitamin D deficiency     Patient Active Problem List   Diagnosis Date Noted   Dyslipidemia 06/18/2014   Palpitations 05/22/2014   GERD (gastroesophageal reflux disease) 05/22/2014    History reviewed. No pertinent surgical history.   OB History   No obstetric history on file.     Family History  Problem Relation Age of Onset   Heart attack Maternal Grandfather    Colon cancer Paternal Grandfather     Social History    Tobacco Use   Smoking status: Never   Smokeless tobacco: Never  Vaping Use   Vaping Use: Never used  Substance Use Topics   Alcohol use: No   Drug use: No    Home Medications Prior to Admission medications   Medication Sig Start Date End Date Taking? Authorizing Provider  cholecalciferol (VITAMIN D) 1000 UNITS tablet Take 1,000 Units by mouth daily.   Yes [provider]  famotidine (PEPCID) 40 MG tablet Take 40 mg by mouth as needed for heartburn. 10/28/20  Yes [provider]  metroNIDAZOLE (METROGEL) 1 % gel Apply 1 application topically daily.   Yes [provider]    Allergies    Cephalexin, Neomycin sulfate [neomycin], and Sulfa antibiotics  Review of Systems   Review of Systems  Constitutional:  Negative for fever.  Respiratory:  Negative for chest tightness and shortness of breath.   Cardiovascular:  Positive for palpitations. Negative for chest pain and leg swelling.  Gastrointestinal:  Positive for nausea. Negative for abdominal pain.  Genitourinary:  Negative for dysuria.  All other systems reviewed and are negative.  Physical Exam Updated Vital Signs BP 103/70   Pulse 95   Temp 98.3 F (36.8 C)   Resp 19   Ht 1.651 m (5\' 5" )   Wt 80.3 kg   SpO2  94%   BMI 29.45 kg/m   Physical Exam Vitals and nursing note reviewed.  Constitutional:      Appearance: She is well-developed.     Comments: Overweight, nontoxic-appearing, no acute distress  HENT:     Head: Normocephalic and atraumatic.     Mouth/Throat:     Mouth: Mucous membranes are moist.  Eyes:     Pupils: Pupils are equal, round, and reactive to light.  Cardiovascular:     Rate and Rhythm: Regular rhythm. Tachycardia present.     Heart sounds: Normal heart sounds.  Pulmonary:     Effort: Pulmonary effort is normal. No respiratory distress.     Breath sounds: No wheezing.  Abdominal:     General: Bowel sounds are normal.     Palpations: Abdomen is soft.   Musculoskeletal:        General: No tenderness.     Cervical back: Neck supple.     Right lower leg: No edema.     Left lower leg: No edema.  Skin:    General: Skin is warm and dry.  Neurological:     Mental Status: She is alert and oriented to person, place, and time.  Psychiatric:     Comments: Anxious appearing    ED Results / Procedures / Treatments   Labs (all labs ordered are listed, but only abnormal results are displayed) Labs Reviewed  BASIC METABOLIC PANEL - Abnormal; Notable for the following components:      Result Value   Glucose, Bld 124 (*)    All other components within normal limits  LIPASE, BLOOD - Abnormal; Notable for the following components:   Lipase 57 (*)    All other components within normal limits  D-DIMER, QUANTITATIVE - Abnormal; Notable for the following components:   D-Dimer, Quant 1.62 (*)    All other components within normal limits  TROPONIN I (HIGH SENSITIVITY) - Abnormal; Notable for the following components:   Troponin I (High Sensitivity) 18 (*)    All other components within normal limits  CBC WITH DIFFERENTIAL/PLATELET  TSH  MAGNESIUM  HEPATIC FUNCTION PANEL  TROPONIN I (HIGH SENSITIVITY)  TROPONIN I (HIGH SENSITIVITY)    EKG EKG Interpretation  Date/Time:  Tuesday January 06 2021 23:52:14 EDT Ventricular Rate:  111 PR Interval:  146 QRS Duration: 92 QT Interval:  331 QTC Calculation: 450 R Axis:   -63 Text Interpretation: Sinus tachycardia Left anterior fascicular block Abnormal R-wave progression, late transition No prior for comparison Confirmed by Ross Marcus ([id number]) on 01/07/2021 1:41:24 AM  Radiology DG Chest 2 View  Result Date: 01/07/2021 CLINICAL DATA:  Palpitations. EXAM: CHEST - 2 VIEW COMPARISON:  None. FINDINGS: The heart size and mediastinal contours are within normal limits. Low lung volumes. No focal consolidation. No pulmonary edema. No pleural effusion. No pneumothorax. No acute osseous abnormality.  IMPRESSION: No active cardiopulmonary disease. Electronically Signed   By: Tish Frederickson M.D.   On: 01/07/2021 01:06   CT Angio Chest PE W and/or Wo Contrast  Result Date: 01/07/2021 CLINICAL DATA:  Positive D-dimer. Suspected pulmonary embolus in a patient with pelvis stations. EXAM: CT ANGIOGRAPHY CHEST WITH CONTRAST TECHNIQUE: Multidetector CT imaging of the chest was performed using the standard protocol during bolus administration of intravenous contrast. Multiplanar CT image reconstructions and MIPs were obtained to evaluate the vascular anatomy. CONTRAST:  77mL OMNIPAQUE IOHEXOL 350 MG/ML SOLN COMPARISON:  None. FINDINGS: Cardiovascular: Satisfactory opacification of the pulmonary arteries to the segmental level. No evidence of  pulmonary embolism. Normal heart size. No pericardial effusion. Mediastinum/Nodes: No enlarged mediastinal, hilar, or axillary lymph nodes. Thyroid gland, trachea, and esophagus demonstrate no significant findings. Small hiatal hernia. Lungs/Pleura: Bilateral lower lobe subsegmental atelectasis. No focal consolidation. No pulmonary nodule. No pulmonary mass. No pleural effusion. No pneumothorax. Upper Abdomen: No acute abnormality. Musculoskeletal: No chest wall abnormality. No suspicious lytic or blastic osseous lesions. No acute displaced fracture. Multilevel degenerative changes of the spine. Review of the MIP images confirms the above findings. IMPRESSION: 1. No pulmonary embolus. 2. No acute pulmonary abnormality. 3. Small hiatal hernia. Electronically Signed   By: Tish Frederickson M.D.   On: 01/07/2021 04:29    Procedures Procedures   Medications Ordered in ED Medications  LORazepam (ATIVAN) tablet 1 mg (1 mg Oral Given 01/07/21 0032)  iohexol (OMNIPAQUE) 350 MG/ML injection 75 mL (75 mLs Intravenous Contrast Given 01/07/21 0326)    ED Course  I have reviewed the triage vital signs and the nursing notes.  Pertinent labs & imaging results that were available  during my care of the patient were reviewed by me and considered in my medical decision making (see chart for details).  Clinical Course as of 01/07/21 0556  Wed Jan 07, 2021  0441 Patient's heart rate improved.  Heart rate in the 90s.  Work-up has been largely reassuring.  She did have a screening D-dimer that was positive but CT scan does not show any evidence of blood clot.  She has a small hiatal hernia which I have discussed with her.  Troponin initially 12.  It bumped slightly to 18 which is technically abnormal.  However, if patient's heart rate at home was in the 160s, this could be related to demand.  She is not having any active chest pain and I doubt ACS.  We will obtain 1 more troponin to continue to trend.  If downtrending, feel this would be very reassuring.  TSH is normal and argues against thyroid disease. [CH]    Clinical Course User Index [CH] Nola Botkins, Mayer Masker, MD   MDM Rules/Calculators/A&P                          Patient presents with palpitations and tachycardia from home.  Reports heart rate up to 160.  Also reports recent increased nausea.  She has been seen and evaluated by cardiology in the past for palpitations and has also seen GI for her reflux.  On my initial evaluation, she is tachycardic in the 120s.  EKG shows sinus tachycardia.  There is no evidence of ST elevation or ischemia.  She denies any alcohol or drug use.  Denies caffeine use.  Work-up initiated.  Given her nausea, troponin was sent to her stratify for ACS as nausea can sometimes be an anginal equivalent in women.  Initial troponin is 12.  Second troponin is 18.  Patient has remained chest pain-free and without shortness of breath.  Tachycardia improved.  Will obtain third troponin.  If downtrending, feel that is likely related to her heart rate.  And not ischemia.  Metabolic panel is reassuring.  TSH is normal arguing against thyroid disease.  Screening D-dimer was sent and was positive.  CT scan of the chest  obtained and shows no evidence of PE.  She does have evidence of a small hiatal hernia.  This is likely contributing to her ongoing reflux and may be an explanation for her nausea as well.  Overall I feel that  her work-up is reassuring.  Heart rate has improved without evidence of arrhythmia on the monitor.  She has cardiology follow-up.  Recommend follow-up for further evaluation.  After history, exam, and medical workup I feel the patient has been appropriately medically screened and is safe for discharge home. Pertinent diagnoses were discussed with the patient. Patient was given return precautions.   Final Clinical Impression(s) / ED Diagnoses Final diagnoses:  Palpitations  Tachycardia  Hiatal hernia    Rx / DC Orders ED Discharge Orders     None        Aleysha Meckler, CourtShon Batonney F, MD 01/07/21 [phone]

## 2021-01-07 NOTE — Discharge Instructions (Addendum)
You are seen today for palpitations and high heart rate.  Your heart rate has improved while in the emergency department.  You appear to be in sinus tachycardia.  He did have a slight elevation in one of your heart tests but that down trended.  The rest of your work-up is reassuring.  You should follow-up with cardiology for further evaluation.  Additionally, you should take the Pepcid prescribed by GI daily given CT evidence of your hiatal hernia.  This could be contributing to your nausea.

## 2021-01-13 NOTE — Progress Notes (Signed)
PCP:  Maurice Small, MD Primary Cardiologist: None Electrophysiologist: Lewayne Bunting, MD   Evelyn Lewis is a 50 y.o. female seen today for Lewayne Bunting, MD for post hospital follow up.    Seen in ED 01/06/21 for palpitations and reports of HRs at home in 150-160 range. D-dimer positive but CTA negative for PE. Work up unrevealing  Since discharge from hospital the patient reports doing OK. For approx 7-10 days afterward she has felt cold intolerance (vs chills), intermittent anxiety,  muscle aches, fatigue, stomach upset, and felt emotional.    For the past 4-5 days she has felt much better from this standpoint.  Currently, she denies chest pain, dyspnea, PND, orthopnea, vomiting, dizziness, syncope, edema, or weight gain. Feels like her appetite is decreased.   Past Medical History:  Diagnosis Date   Anxiety about health    Gastro-esophageal reflux disease without esophagitis    Occasionally   Palpitations    Sinus pressure    Sleep disturbance    "snorts" at night, and has vivid dreams - she has gained some weight and worries about sleep apnea - she says her family has told her she doesn't have apneic episodes, and she says she's fatigued, but not sleepy.   Tick bite 12/05/13   Back of neck   URI (upper respiratory infection) 12/2013   Vitamin D deficiency    History reviewed. No pertinent surgical history.  Current Outpatient Medications  Medication Sig Dispense Refill   cholecalciferol (VITAMIN D) 1000 UNITS tablet Take 1,000 Units by mouth daily.     famotidine (PEPCID) 40 MG tablet Take 40 mg by mouth as needed for heartburn.     metroNIDAZOLE (METROGEL) 1 % gel Apply 1 application topically daily.     No current facility-administered medications for this visit.    Allergies  Allergen Reactions   Cephalexin Other (See Comments)    Rash. Happened years ago, unsure of severity level   Neomycin Sulfate [Neomycin] Rash   Sulfa Antibiotics Other (See Comments)     UNKNOWN REACTION    Social History   Socioeconomic History   Marital status: Divorced    Spouse name: Not on file   Number of children: Not on file   Years of education: Not on file   Highest education level: Not on file  Occupational History   Not on file  Tobacco Use   Smoking status: Never   Smokeless tobacco: Never  Vaping Use   Vaping Use: Never used  Substance and Sexual Activity   Alcohol use: No   Drug use: No   Sexual activity: Not on file  Other Topics Concern   Not on file  Social History Narrative   Not on file   Social Determinants of Health   Financial Resource Strain: Not on file  Food Insecurity: Not on file  Transportation Needs: Not on file  Physical Activity: Not on file  Stress: Not on file  Social Connections: Not on file  Intimate Partner Violence: Not on file     Review of Systems: General: No chills, fever, night sweats or weight changes  Cardiovascular:  No chest pain, dyspnea on exertion, edema, orthopnea, palpitations, paroxysmal nocturnal dyspnea Dermatological: No rash, lesions or masses Respiratory: No cough, dyspnea Urologic: No hematuria, dysuria Abdominal: No nausea, vomiting, diarrhea, bright red blood per rectum, melena, or hematemesis Neurologic: No visual changes, weakness, changes in mental status All other systems reviewed and are otherwise negative except as noted above.  Physical  Exam: Vitals:   01/20/21 0816  BP: 112/82  Pulse: (!) 106  SpO2: 98%  Weight: 177 lb (80.3 kg)  Height: 5\' 4"  (1.626 m)    GEN- The patient is well appearing, alert and oriented x 3 today.   HEENT: normocephalic, atraumatic; sclera clear, conjunctiva pink; hearing intact; oropharynx clear; neck supple, no JVP Lymph- no cervical lymphadenopathy Lungs- Clear to ausculation bilaterally, normal work of breathing.  No wheezes, rales, rhonchi Heart- Regular rate and rhythm, no murmurs, rubs or gallops, PMI not laterally displaced GI- soft,  non-tender, non-distended, bowel sounds present, no hepatosplenomegaly Extremities- no clubbing, cyanosis, or edema; DP/PT/radial pulses 2+ bilaterally MS- no significant deformity or atrophy Skin- warm and dry, no rash or lesion Psych- euthymic mood, full affect Neuro- strength and sensation are intact  EKG is not ordered. Personal review of EKG from  01/07/2021  shows sinus tachycardia at 11 bpm  Additional studies reviewed include: Previous EP office notes  Monitor 12/2016 1. NSR with sinus tachycardia and sinus bradycardia 2. Rare PVC's and PAC's 3. Rare episodes of NS atrial tachycardia at 155/min, lasting seconds 4. No significant pauses  Assessment and Plan:  1. Palpitations So far she has had sinus tach, short atrial tachycardia PACs, and PVCs Re-enforced the benign nature of her symptoms.  Encouraged to avoid triggers such as caffeine and ETOH.  She is re-assured today.  Labwork unrevealing at ER Will give diltiazem 30 mg to take as needed for rapid HRs   2. Obesity Body mass index is 30.38 kg/m.  Encouraged continued exercise  RTC 3-4 months. Will give [employer] information as another means for her to monitor her arrhythmias and for re-assurance.   01/2017, PA-C  01/20/21 8:24 AM

## 2021-01-20 ENCOUNTER — Other Ambulatory Visit: Payer: Self-pay

## 2021-01-20 ENCOUNTER — Ambulatory Visit: Payer: BC Managed Care – PPO | Admitting: Student

## 2021-01-20 ENCOUNTER — Encounter: Payer: Self-pay | Admitting: Student

## 2021-01-20 VITALS — BP 112/82 | HR 106 | Ht 64.0 in | Wt 177.0 lb

## 2021-01-20 DIAGNOSIS — R002 Palpitations: Secondary | ICD-10-CM | POA: Diagnosis not present

## 2021-01-20 MED ORDER — DILTIAZEM HCL 30 MG PO TABS: 30.0000 mg | ORAL_TABLET | ORAL | 1 refills | Status: AC | PRN

## 2021-01-20 NOTE — Patient Instructions (Signed)
Medication Instructions:  Your physician has recommended you make the following change in your medication:  START: Diltiazem 30mg  as needed for Palpitations  *If you need a refill on your cardiac medications before your next appointment, please call your pharmacy*   Lab Work: None If you have labs (blood work) drawn today and your tests are completely normal, you will receive your results only by: MyChart Message (if you have MyChart) OR A paper copy in the mail If you have any lab test that is abnormal or we need to change your treatment, we will call you to review the results.   Follow-Up: At [facility], you and your health needs are our priority.  As part of our continuing mission to provide you with exceptional heart care, we have created designated Provider Care Teams.  These Care Teams include your primary Cardiologist (physician) and Advanced Practice Providers (APPs -  Physician Assistants and Nurse Practitioners) who all work together to provide you with the care you need, when you need it.   Your next appointment:   As scheduled  Other Instructions [employer]  FDA-cleared EKG at your fingertips. - [employer]   [facility], [employer]. [email or web address]   FDA-cleared, clinical grade mobile EKG monitor: [employer] is the most clinically-validated mobile EKG used by the world's leading cardiac care medical professionals.  This may be useful in monitoring palpitations.  We do not have access to have them emailed and reviewed but will be glad to review while in the office.

## 2021-02-23 ENCOUNTER — Other Ambulatory Visit: Payer: Self-pay | Admitting: Obstetrics and Gynecology

## 2021-02-23 DIAGNOSIS — Z1231 Encounter for screening mammogram for malignant neoplasm of breast: Secondary | ICD-10-CM

## 2021-05-14 ENCOUNTER — Ambulatory Visit: Payer: BC Managed Care – PPO

## 2021-05-25 NOTE — Progress Notes (Signed)
PCP:  Maurice Small, MD Primary Cardiologist: None Electrophysiologist: Lewayne Bunting, MD   Evelyn Lewis is a 50 y.o. female seen today for Lewayne Bunting, MD for routine electrophysiology followup.  Since last being seen in our clinic the patient reports doing about the same. She continues to have intermittent palpitations several times a week at least. Describes them either as a "hiccup" in her chest, fluttering, or a "funny" sensation in her neck. Anxiety may worsen these at times. she denies chest pain, dyspnea, PND, orthopnea, nausea, vomiting, dizziness, syncope, edema, weight gain, or early satiety.  Past Medical History:  Diagnosis Date   Anxiety about health    Gastro-esophageal reflux disease without esophagitis    Occasionally   Palpitations    Sinus pressure    Sleep disturbance    "snorts" at night, and has vivid dreams - she has gained some weight and worries about sleep apnea - she says her family has told her she doesn't have apneic episodes, and she says she's fatigued, but not sleepy.   Tick bite 12/05/13   Back of neck   URI (upper respiratory infection) 12/2013   Vitamin D deficiency    History reviewed. No pertinent surgical history.  Current Outpatient Medications  Medication Sig Dispense Refill   cholecalciferol (VITAMIN D) 1000 UNITS tablet Take 2,000 Units by mouth daily.     diltiazem (CARDIZEM) 30 MG tablet Take 1 tablet (30 mg total) by mouth as needed (Palpitations). 90 tablet 1   famotidine (PEPCID) 40 MG tablet Take 40 mg by mouth daily.     metroNIDAZOLE (METROGEL) 1 % gel Apply 1 application topically daily.     No current facility-administered medications for this visit.    Allergies  Allergen Reactions   Cephalexin Other (See Comments)    Rash. Happened years ago, unsure of severity level   Neomycin Sulfate [Neomycin] Rash   Sulfa Antibiotics Other (See Comments)    UNKNOWN REACTION    Social History   Socioeconomic History    Marital status: Divorced    Spouse name: Not on file   Number of children: Not on file   Years of education: Not on file   Highest education level: Not on file  Occupational History   Not on file  Tobacco Use   Smoking status: Never   Smokeless tobacco: Never  Vaping Use   Vaping Use: Never used  Substance and Sexual Activity   Alcohol use: No   Drug use: No   Sexual activity: Not on file  Other Topics Concern   Not on file  Social History Narrative   Not on file   Social Determinants of Health   Financial Resource Strain: Not on file  Food Insecurity: Not on file  Transportation Needs: Not on file  Physical Activity: Not on file  Stress: Not on file  Social Connections: Not on file  Intimate Partner Violence: Not on file     Review of Systems: All other systems reviewed and are otherwise negative except as noted above.  Physical Exam: Vitals:   05/26/21 0751  BP: 124/72  Pulse: 97  SpO2: 100%  Weight: 177 lb (80.3 kg)  Height: 5\' 4"  (1.626 m)    GEN- The patient is well appearing, alert and oriented x 3 today.   HEENT: normocephalic, atraumatic; sclera clear, conjunctiva pink; hearing intact; oropharynx clear; neck supple, no JVP Lymph- no cervical lymphadenopathy Lungs- Clear to ausculation bilaterally, normal work of breathing.  No wheezes, rales,  rhonchi Heart- Regular rate and rhythm, no murmurs, rubs or gallops, PMI not laterally displaced GI- soft, non-tender, non-distended, bowel sounds present, no hepatosplenomegaly Extremities- no clubbing, cyanosis, or edema; DP/PT/radial pulses 2+ bilaterally MS- no significant deformity or atrophy Skin- warm and dry, no rash or lesion Psych- euthymic mood, full affect Neuro- strength and sensation are intact  EKG is not ordered.   Additional studies reviewed include: Previous EP office notes.   Assessment and Plan:  1. Palpitations So far she has had sinus tach, short atrial tachycardia PACs, and  PVCs Continue to reinforce the benign nature of her symptoms.  Encouraged to avoid triggers such as caffeine and ETOH.  Continue diltiazem 30 mg prn for rapid HRs    2. Obesity Body mass index is 30.38 kg/m.  Encouraged continued exercise   Follow up with Dr. Ladona Ridgel in 12 months. Sooner with worsening symptoms. She understands there is little utility for monitoring at this juncture. Again recommended [facility] for point of Care EKG.  Next step if palpitations become bothersome would be to try low dose diltiazem or BB.   Graciella Freer, PA-C  05/26/21 8:00 AM

## 2021-05-26 ENCOUNTER — Ambulatory Visit: Payer: BC Managed Care – PPO | Admitting: Student

## 2021-05-26 ENCOUNTER — Other Ambulatory Visit: Payer: Self-pay

## 2021-05-26 ENCOUNTER — Encounter: Payer: Self-pay | Admitting: Student

## 2021-05-26 VITALS — BP 124/72 | HR 97 | Ht 64.0 in | Wt 177.0 lb

## 2021-05-26 DIAGNOSIS — R002 Palpitations: Secondary | ICD-10-CM | POA: Diagnosis not present

## 2021-06-03 ENCOUNTER — Other Ambulatory Visit: Payer: Self-pay

## 2021-06-03 ENCOUNTER — Ambulatory Visit
Admission: RE | Admit: 2021-06-03 | Discharge: 2021-06-03 | Disposition: A | Discharge status: Home / Self Care | Payer: BC Managed Care – PPO | Source: Ambulatory Visit | Attending: Obstetrics and Gynecology | Admitting: Obstetrics and Gynecology

## 2021-06-03 DIAGNOSIS — Z1231 Encounter for screening mammogram for malignant neoplasm of breast: Secondary | ICD-10-CM

## 2021-06-05 ENCOUNTER — Other Ambulatory Visit: Payer: Self-pay | Admitting: Obstetrics and Gynecology

## 2021-06-05 DIAGNOSIS — R928 Other abnormal and inconclusive findings on diagnostic imaging of breast: Secondary | ICD-10-CM

## 2021-06-08 ENCOUNTER — Other Ambulatory Visit: Payer: Self-pay | Admitting: Family Medicine

## 2021-06-15 ENCOUNTER — Other Ambulatory Visit: Payer: Self-pay | Admitting: Obstetrics and Gynecology

## 2021-06-15 ENCOUNTER — Other Ambulatory Visit: Payer: Self-pay

## 2021-06-15 ENCOUNTER — Ambulatory Visit
Admission: RE | Admit: 2021-06-15 | Discharge: 2021-06-15 | Disposition: A | Discharge status: Home / Self Care | Payer: BC Managed Care – PPO | Source: Ambulatory Visit | Attending: Obstetrics and Gynecology | Admitting: Obstetrics and Gynecology

## 2021-06-15 DIAGNOSIS — R928 Other abnormal and inconclusive findings on diagnostic imaging of breast: Secondary | ICD-10-CM

## 2021-06-15 DIAGNOSIS — R921 Mammographic calcification found on diagnostic imaging of breast: Secondary | ICD-10-CM

## 2021-12-15 ENCOUNTER — Other Ambulatory Visit: Payer: Self-pay | Admitting: Obstetrics and Gynecology

## 2021-12-15 ENCOUNTER — Ambulatory Visit
Admission: RE | Admit: 2021-12-15 | Discharge: 2021-12-15 | Disposition: A | Discharge status: Home / Self Care | Payer: BC Managed Care – PPO | Source: Ambulatory Visit | Attending: Obstetrics and Gynecology | Admitting: Obstetrics and Gynecology

## 2021-12-15 DIAGNOSIS — R921 Mammographic calcification found on diagnostic imaging of breast: Secondary | ICD-10-CM

## 2021-12-15 DIAGNOSIS — N6320 Unspecified lump in the left breast, unspecified quadrant: Secondary | ICD-10-CM

## 2022-06-18 ENCOUNTER — Other Ambulatory Visit: Payer: Self-pay | Admitting: Otolaryngology

## 2022-06-18 ENCOUNTER — Ambulatory Visit
Admission: RE | Admit: 2022-06-18 | Discharge: 2022-06-18 | Disposition: A | Discharge status: Home / Self Care | Payer: BC Managed Care – PPO | Source: Ambulatory Visit | Attending: Obstetrics and Gynecology | Admitting: Obstetrics and Gynecology

## 2022-06-18 DIAGNOSIS — R921 Mammographic calcification found on diagnostic imaging of breast: Secondary | ICD-10-CM

## 2022-06-26 IMAGING — MG DIGITAL SCREENING BILAT W/ TOMO W/ CAD
8 series · 8 of 24 positions shown · non-contrast
Comparison: Previous exam(s).

CLINICAL DATA: Screening.

EXAM:
DIGITAL SCREENING BILATERAL MAMMOGRAM WITH TOMO AND CAD

[R CC synth-2D]
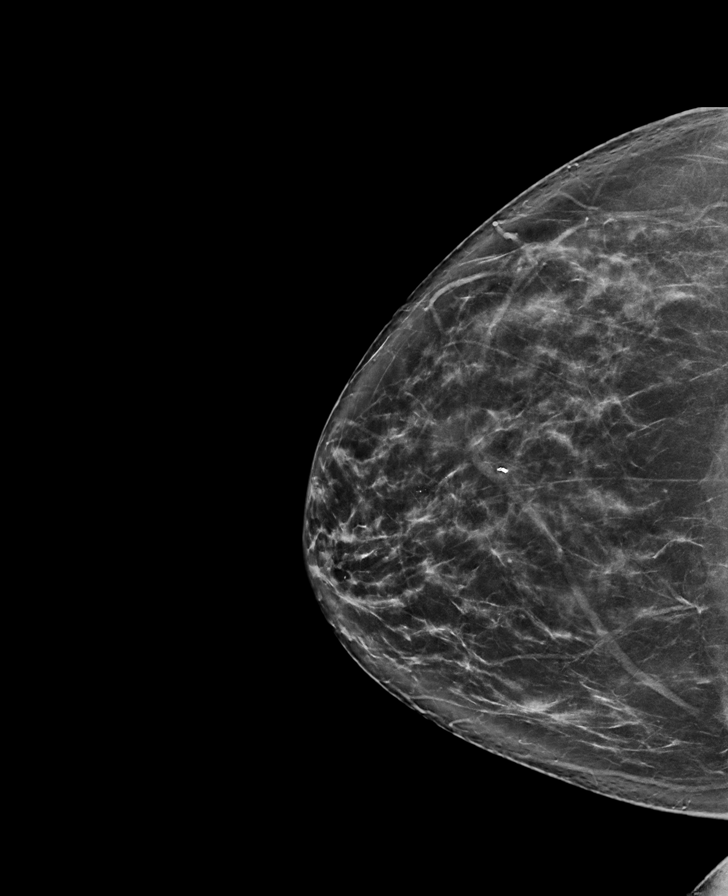

[L CC synth-2D]
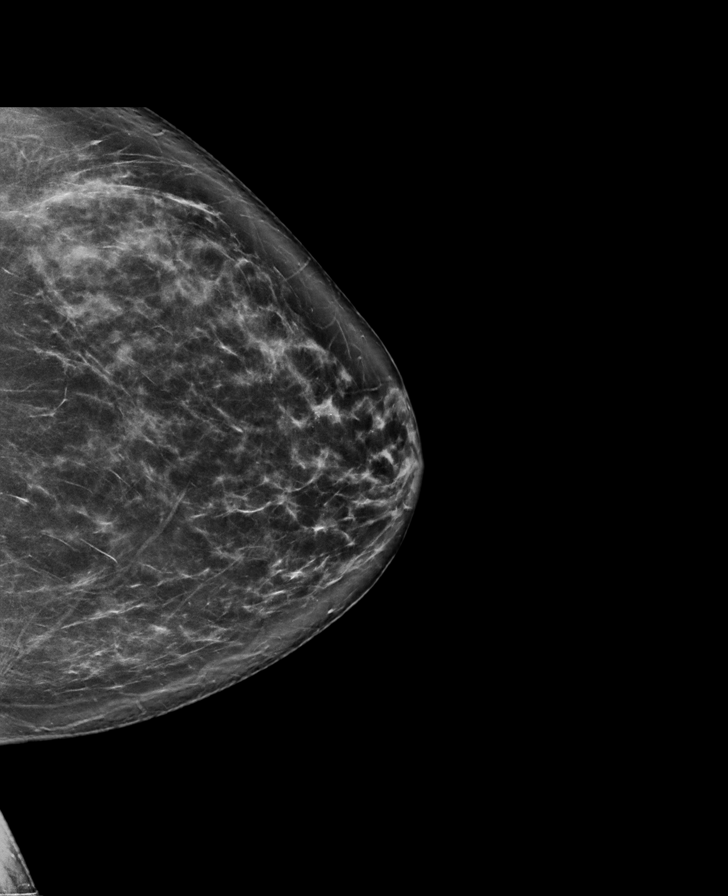

[L MLO synth-2D]
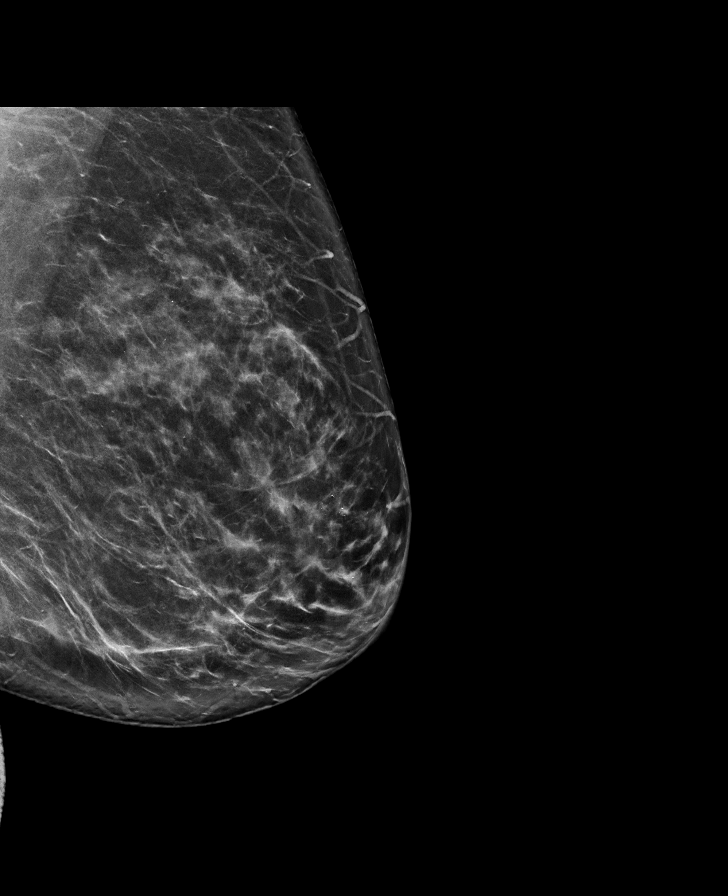

[R MLO synth-2D]
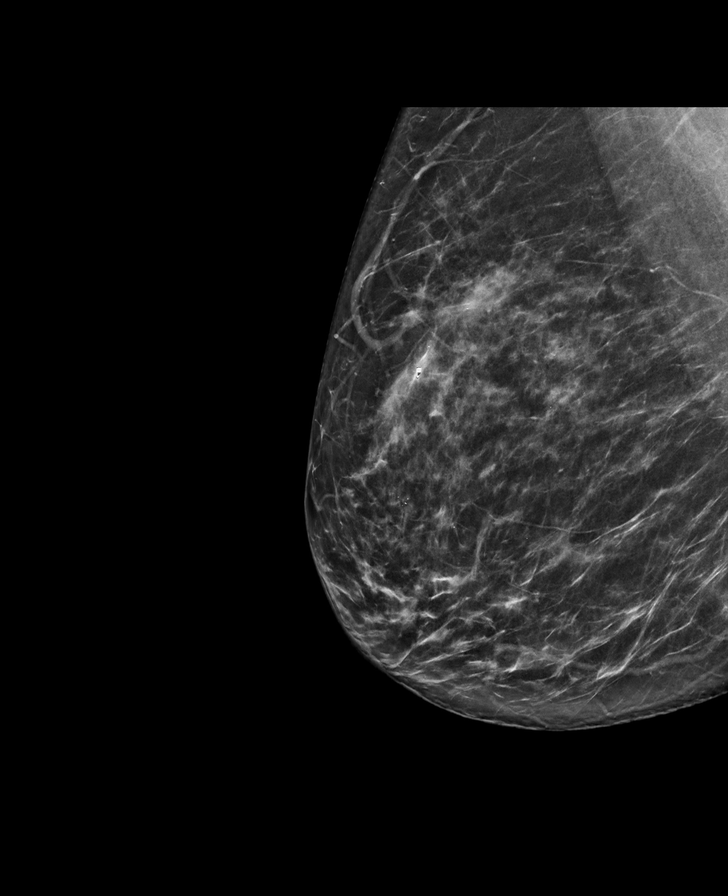

[L CC tomo · tomo slice 44/87.0]
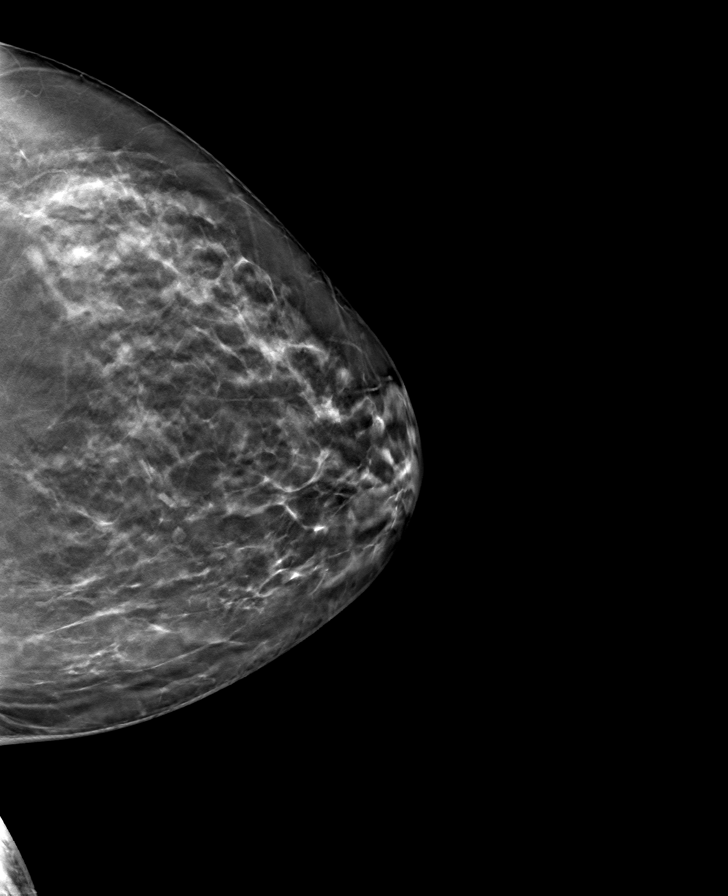

[L MLO tomo · tomo slice 45/88.0]
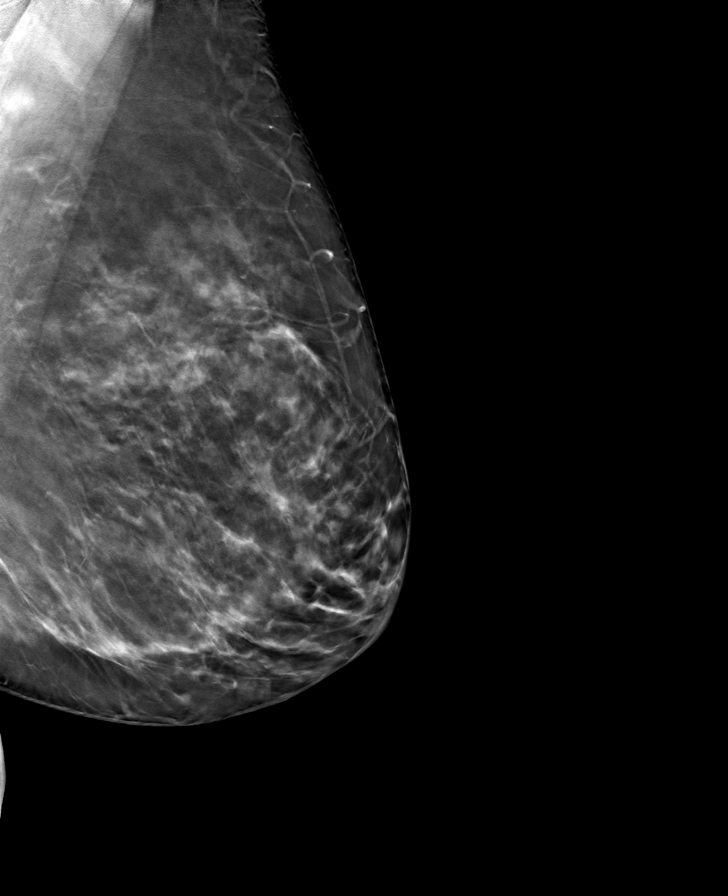

[R CC tomo · tomo slice 42/83.0]
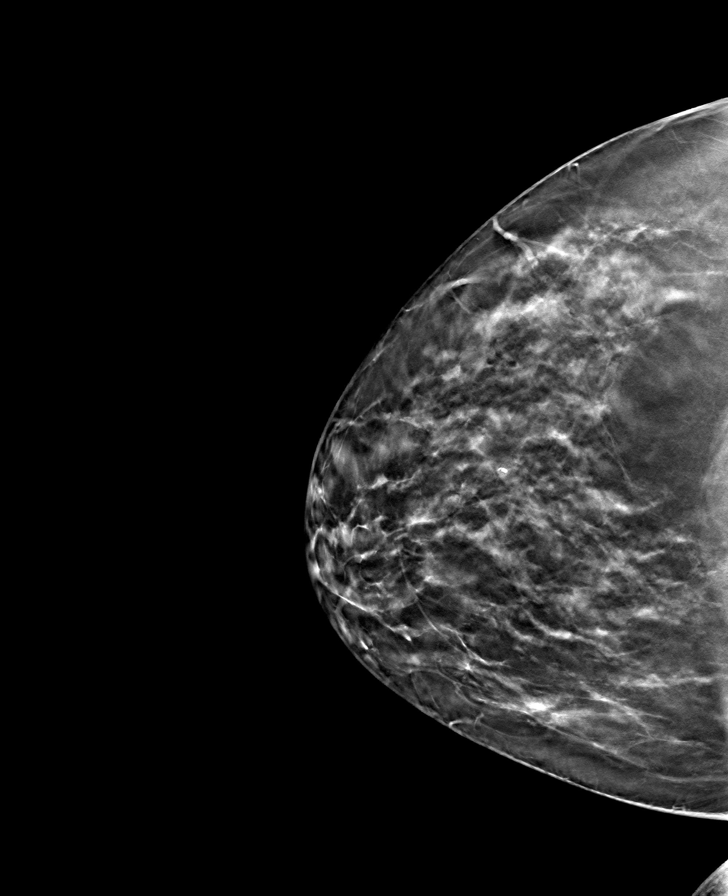

[R MLO tomo · tomo slice 41/80.0]
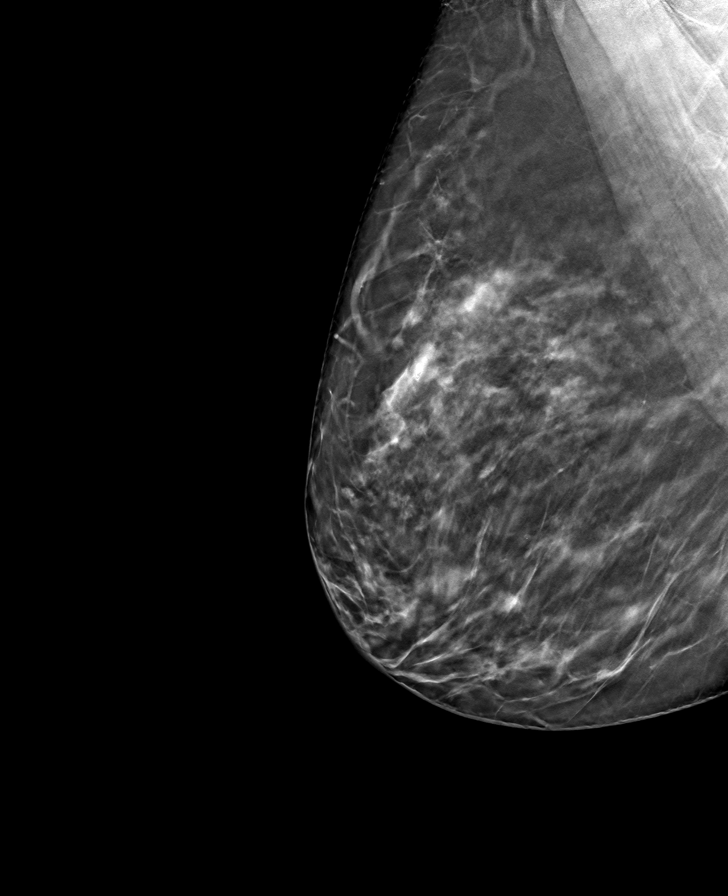

[8 of 24 positions shown; findings below may reference images not displayed]

ACR Breast Density Category c: The breast tissue is heterogeneously
dense, which may obscure small masses.
FINDINGS: There are no findings suspicious for malignancy. Images were
processed with CAD.
IMPRESSION: No mammographic evidence of malignancy. A result letter of this
screening mammogram will be mailed directly to the patient.

RECOMMENDATION:
Screening mammogram in one year. (Code:[id number])

BI-RADS CATEGORY  1: Negative.

## 2022-06-29 ENCOUNTER — Other Ambulatory Visit: Payer: Self-pay

## 2022-06-29 ENCOUNTER — Encounter (HOSPITAL_COMMUNITY): Payer: Self-pay | Admitting: Otolaryngology

## 2022-06-29 NOTE — Progress Notes (Signed)
Ms. Name denies chest pain or shortness of breath.  Patient denies having any s/s of Covid in her household, also denies any known exposure to Covid.  Ms Kruczek PCP is Dr Precious Reel with [facility].  Patient has been seen by [facility] for palpations, she has not had a problem with palpations recently.  MS Spano has a prn for Dialitazem, if she experiences palpations.

## 2022-06-30 ENCOUNTER — Encounter (HOSPITAL_COMMUNITY): Payer: Self-pay | Admitting: Otolaryngology

## 2022-06-30 ENCOUNTER — Other Ambulatory Visit: Payer: Self-pay

## 2022-06-30 ENCOUNTER — Ambulatory Visit (HOSPITAL_COMMUNITY)
Admission: RE | Admit: 2022-06-30 | Discharge: 2022-06-30 | Disposition: A | Discharge status: Home / Self Care | Payer: BC Managed Care – PPO | Attending: Otolaryngology | Admitting: Otolaryngology

## 2022-06-30 ENCOUNTER — Ambulatory Visit (HOSPITAL_COMMUNITY): Payer: BC Managed Care – PPO | Admitting: Anesthesiology

## 2022-06-30 ENCOUNTER — Other Ambulatory Visit (HOSPITAL_COMMUNITY): Payer: Self-pay

## 2022-06-30 ENCOUNTER — Encounter (HOSPITAL_COMMUNITY)
Admission: RE | Disposition: A | Discharge status: Home / Self Care | Payer: Self-pay | Source: Home / Self Care | Attending: Otolaryngology

## 2022-06-30 DIAGNOSIS — J343 Hypertrophy of nasal turbinates: Secondary | ICD-10-CM | POA: Insufficient documentation

## 2022-06-30 DIAGNOSIS — J338 Other polyp of sinus: Secondary | ICD-10-CM | POA: Diagnosis present

## 2022-06-30 DIAGNOSIS — K219 Gastro-esophageal reflux disease without esophagitis: Secondary | ICD-10-CM | POA: Insufficient documentation

## 2022-06-30 DIAGNOSIS — J330 Polyp of nasal cavity: Secondary | ICD-10-CM | POA: Insufficient documentation

## 2022-06-30 HISTORY — DX: Family history of other specified conditions: Z84.89

## 2022-06-30 HISTORY — PX: SINUS ENDO WITH FUSION: SHX5329

## 2022-06-30 LAB — CBC
HCT: 42.9 % (ref 36.0–46.0)
Hemoglobin: 13.8 g/dL (ref 12.0–15.0)
MCH: 27.4 pg (ref 26.0–34.0)
MCHC: 32.2 g/dL (ref 30.0–36.0)
MCV: 85.1 fL (ref 80.0–100.0)
Platelets: 206 10*3/uL (ref 150–400)
RBC: 5.04 MIL/uL (ref 3.87–5.11)
RDW: 13.4 % (ref 11.5–15.5)
WBC: 6.2 10*3/uL (ref 4.0–10.5)
nRBC: 0 % (ref 0.0–0.2)

## 2022-06-30 LAB — POCT PREGNANCY, URINE: Preg Test, Ur: NEGATIVE

## 2022-06-30 SURGERY — SURGERY, PARANASAL SINUS, ENDOSCOPIC, WITH NASAL SEPTOPLASTY, TURBINOPLASTY, AND MAXILLARY SINUSOTOMY
Anesthesia: General | Site: Nose | Laterality: Bilateral

## 2022-06-30 MED ORDER — ONDANSETRON HCL 4 MG/2ML IJ SOLN
INTRAMUSCULAR | Status: DC | PRN
  Administered 2022-06-30: 4 mg via INTRAVENOUS

## 2022-06-30 MED ORDER — PHENYLEPHRINE 80 MCG/ML (10ML) SYRINGE FOR IV PUSH (FOR BLOOD PRESSURE SUPPORT)
PREFILLED_SYRINGE | INTRAVENOUS | Status: AC
  Filled 2022-06-30: qty 10

## 2022-06-30 MED ORDER — LIDOCAINE-EPINEPHRINE 1 %-1:100000 IJ SOLN
INTRAMUSCULAR | Status: AC
  Filled 2022-06-30: qty 1

## 2022-06-30 MED ORDER — ACETAMINOPHEN 160 MG/5ML PO SOLN: 325.0000 mg | Freq: Once | ORAL | Status: DC | PRN

## 2022-06-30 MED ORDER — CHLORHEXIDINE GLUCONATE 0.12 % MT SOLN
15.0000 mL | Freq: Once | OROMUCOSAL | Status: AC
  Administered 2022-06-30: 15 mL via OROMUCOSAL
  Filled 2022-06-30: qty 15

## 2022-06-30 MED ORDER — PROPOFOL 10 MG/ML IV BOLUS
INTRAVENOUS | Status: DC | PRN
  Administered 2022-06-30: 140 mg via INTRAVENOUS

## 2022-06-30 MED ORDER — LACTATED RINGERS IV SOLN: INTRAVENOUS | Status: DC

## 2022-06-30 MED ORDER — ORAL CARE MOUTH RINSE: 15.0000 mL | Freq: Once | OROMUCOSAL | Status: AC

## 2022-06-30 MED ORDER — ACETAMINOPHEN 10 MG/ML IV SOLN: 1000.0000 mg | Freq: Once | INTRAVENOUS | Status: DC | PRN

## 2022-06-30 MED ORDER — ONDANSETRON HCL 4 MG/2ML IJ SOLN
INTRAMUSCULAR | Status: AC
  Filled 2022-06-30: qty 4

## 2022-06-30 MED ORDER — ACETAMINOPHEN 500 MG PO TABS
1000.0000 mg | ORAL_TABLET | Freq: Once | ORAL | Status: AC
  Administered 2022-06-30: 1000 mg via ORAL
  Filled 2022-06-30: qty 2

## 2022-06-30 MED ORDER — HYDROMORPHONE HCL 1 MG/ML IJ SOLN: 0.2500 mg | INTRAMUSCULAR | Status: DC | PRN

## 2022-06-30 MED ORDER — LIDOCAINE 2% (20 MG/ML) 5 ML SYRINGE
INTRAMUSCULAR | Status: AC
  Filled 2022-06-30: qty 20

## 2022-06-30 MED ORDER — LIDOCAINE-EPINEPHRINE 1 %-1:100000 IJ SOLN
INTRAMUSCULAR | Status: DC | PRN
  Administered 2022-06-30: 8 mL

## 2022-06-30 MED ORDER — AMISULPRIDE (ANTIEMETIC) 5 MG/2ML IV SOLN: 10.0000 mg | Freq: Once | INTRAVENOUS | Status: DC | PRN

## 2022-06-30 MED ORDER — MEPERIDINE HCL 25 MG/ML IJ SOLN: 6.2500 mg | INTRAMUSCULAR | Status: DC | PRN

## 2022-06-30 MED ORDER — ROCURONIUM BROMIDE 10 MG/ML (PF) SYRINGE
PREFILLED_SYRINGE | INTRAVENOUS | Status: DC | PRN
  Administered 2022-06-30: 60 mg via INTRAVENOUS

## 2022-06-30 MED ORDER — OXYMETAZOLINE HCL 0.05 % NA SOLN
NASAL | Status: DC | PRN
  Administered 2022-06-30: 1 via TOPICAL

## 2022-06-30 MED ORDER — PROPOFOL 10 MG/ML IV BOLUS
INTRAVENOUS | Status: AC
  Filled 2022-06-30: qty 20

## 2022-06-30 MED ORDER — MIDAZOLAM HCL 2 MG/2ML IJ SOLN
INTRAMUSCULAR | Status: AC
  Filled 2022-06-30: qty 2

## 2022-06-30 MED ORDER — PROMETHAZINE HCL 25 MG/ML IJ SOLN: 6.2500 mg | INTRAMUSCULAR | Status: DC | PRN

## 2022-06-30 MED ORDER — EPHEDRINE 5 MG/ML INJ
INTRAVENOUS | Status: AC
  Filled 2022-06-30: qty 5

## 2022-06-30 MED ORDER — SCOPOLAMINE 1 MG/3DAYS TD PT72
1.0000 | MEDICATED_PATCH | TRANSDERMAL | Status: DC
  Administered 2022-06-30: 1.5 mg via TRANSDERMAL
  Filled 2022-06-30: qty 1

## 2022-06-30 MED ORDER — EPINEPHRINE HCL (NASAL) 0.1 % NA SOLN
NASAL | Status: AC
  Filled 2022-06-30: qty 30

## 2022-06-30 MED ORDER — PHENYLEPHRINE 80 MCG/ML (10ML) SYRINGE FOR IV PUSH (FOR BLOOD PRESSURE SUPPORT)
PREFILLED_SYRINGE | INTRAVENOUS | Status: DC | PRN
  Administered 2022-06-30 (×3): 160 ug via INTRAVENOUS

## 2022-06-30 MED ORDER — 0.9 % SODIUM CHLORIDE (POUR BTL) OPTIME
TOPICAL | Status: DC | PRN
  Administered 2022-06-30: 1000 mL

## 2022-06-30 MED ORDER — FENTANYL CITRATE (PF) 250 MCG/5ML IJ SOLN
INTRAMUSCULAR | Status: AC
  Filled 2022-06-30: qty 5

## 2022-06-30 MED ORDER — SUCCINYLCHOLINE CHLORIDE 200 MG/10ML IV SOSY
PREFILLED_SYRINGE | INTRAVENOUS | Status: AC
  Filled 2022-06-30: qty 20

## 2022-06-30 MED ORDER — HEMOSTATIC AGENTS (NO CHARGE) OPTIME
TOPICAL | Status: DC | PRN
  Administered 2022-06-30: 1 via TOPICAL

## 2022-06-30 MED ORDER — PHENYLEPHRINE HCL-NACL 20-0.9 MG/250ML-% IV SOLN
INTRAVENOUS | Status: DC | PRN
  Administered 2022-06-30: 45 ug/min via INTRAVENOUS

## 2022-06-30 MED ORDER — ROCURONIUM BROMIDE 10 MG/ML (PF) SYRINGE
PREFILLED_SYRINGE | INTRAVENOUS | Status: AC
  Filled 2022-06-30: qty 20

## 2022-06-30 MED ORDER — DEXAMETHASONE SODIUM PHOSPHATE 10 MG/ML IJ SOLN
INTRAMUSCULAR | Status: DC | PRN
  Administered 2022-06-30: 10 mg via INTRAVENOUS

## 2022-06-30 MED ORDER — ONDANSETRON HCL 4 MG/2ML IJ SOLN
INTRAMUSCULAR | Status: AC
  Filled 2022-06-30: qty 8

## 2022-06-30 MED ORDER — VANCOMYCIN HCL IN DEXTROSE 1-5 GM/200ML-% IV SOLN
1000.0000 mg | INTRAVENOUS | Status: AC
  Administered 2022-06-30: 1000 mg via INTRAVENOUS
  Filled 2022-06-30: qty 200

## 2022-06-30 MED ORDER — FENTANYL CITRATE (PF) 250 MCG/5ML IJ SOLN
INTRAMUSCULAR | Status: DC | PRN
  Administered 2022-06-30 (×2): 50 ug via INTRAVENOUS

## 2022-06-30 MED ORDER — DEXAMETHASONE SODIUM PHOSPHATE 10 MG/ML IJ SOLN
INTRAMUSCULAR | Status: AC
  Filled 2022-06-30: qty 2

## 2022-06-30 MED ORDER — SODIUM CHLORIDE 0.9 % IR SOLN
Status: DC | PRN
  Administered 2022-06-30: 1000 mL

## 2022-06-30 MED ORDER — EPINEPHRINE HCL (NASAL) 0.1 % NA SOLN
NASAL | Status: DC | PRN
  Administered 2022-06-30: 10 mL via TOPICAL

## 2022-06-30 MED ORDER — ACETAMINOPHEN 325 MG PO TABS: 325.0000 mg | ORAL_TABLET | Freq: Once | ORAL | Status: DC | PRN

## 2022-06-30 MED ORDER — HYDROCODONE-ACETAMINOPHEN 5-325 MG PO TABS
1.0000 | ORAL_TABLET | Freq: Four times a day (QID) | ORAL | 0 refills | Status: AC | PRN
  Filled 2022-06-30: qty 5, 3d supply, fill #0

## 2022-06-30 MED ORDER — SUGAMMADEX SODIUM 200 MG/2ML IV SOLN
INTRAVENOUS | Status: DC | PRN
  Administered 2022-06-30: 200 mg via INTRAVENOUS

## 2022-06-30 MED ORDER — MIDAZOLAM HCL 2 MG/2ML IJ SOLN
INTRAMUSCULAR | Status: DC | PRN
  Administered 2022-06-30: 2 mg via INTRAVENOUS

## 2022-06-30 MED ORDER — DEXAMETHASONE SODIUM PHOSPHATE 10 MG/ML IJ SOLN
INTRAMUSCULAR | Status: AC
  Filled 2022-06-30: qty 3

## 2022-06-30 MED ORDER — LIDOCAINE 2% (20 MG/ML) 5 ML SYRINGE
INTRAMUSCULAR | Status: DC | PRN
  Administered 2022-06-30: 40 mg via INTRAVENOUS

## 2022-06-30 SURGICAL SUPPLY — 30 items
BAG COUNTER SPONGE SURGICOUNT (BAG) ×1 IMPLANT
BAG SPNG CNTER NS LX DISP (BAG) ×1
BLADE RAD60 ROTATE M4 4 5PK (BLADE) IMPLANT
BLADE ROTATE TRICUT 4X13 M4 (BLADE) ×1 IMPLANT
CANISTER SUCT 3000ML PPV (MISCELLANEOUS) ×2 IMPLANT
DRAPE HALF SHEET 40X57 (DRAPES) IMPLANT
ELECT REM PT RETURN 9FT ADLT (ELECTROSURGICAL) ×1
ELECTRODE REM PT RTRN 9FT ADLT (ELECTROSURGICAL) ×1 IMPLANT
FILTER ARTHROSCOPY CONVERTOR (FILTER) IMPLANT
GLOVE BIO SURGEON STRL SZ 6.5 (GLOVE) ×1 IMPLANT
HEMOSTAT ARISTA ABSORB 3G PWDR (HEMOSTASIS) IMPLANT
KIT BASIN OR (CUSTOM PROCEDURE TRAY) ×1 IMPLANT
KIT TURNOVER KIT B (KITS) ×1 IMPLANT
NDL HYPO 25GX1X1/2 BEV (NEEDLE) ×2 IMPLANT
NDL SPNL 25GX3.5 QUINCKE BL (NEEDLE) ×1 IMPLANT
NEEDLE HYPO 25GX1X1/2 BEV (NEEDLE) ×1 IMPLANT
NEEDLE SPNL 25GX3.5 QUINCKE BL (NEEDLE) ×1 IMPLANT
NS IRRIG 1000ML POUR BTL (IV SOLUTION) ×1 IMPLANT
PAD ARMBOARD 7.5X6 YLW CONV (MISCELLANEOUS) ×2 IMPLANT
PATTIES SURGICAL .5 X3 (DISPOSABLE) ×1 IMPLANT
SOL ANTI FOG 6CC (MISCELLANEOUS) ×1 IMPLANT
SPLINT NASAL POSISEP X .6X2 (GAUZE/BANDAGES/DRESSINGS) ×1 IMPLANT
SYR CONTROL 10ML LL (SYRINGE) IMPLANT
SYR TB 1ML LUER SLIP (SYRINGE) ×2 IMPLANT
TOWEL GREEN STERILE FF (TOWEL DISPOSABLE) ×1 IMPLANT
TRACKER ENT INSTRUMENT (MISCELLANEOUS) ×2 IMPLANT
TRACKER ENT PATIENT (MISCELLANEOUS) ×1 IMPLANT
TRAY ENT MC OR (CUSTOM PROCEDURE TRAY) ×1 IMPLANT
TUBE CONNECTING 12X1/4 (SUCTIONS) ×1 IMPLANT
TUBING STRAIGHTSHOT EPS 5PK (TUBING) ×1 IMPLANT

## 2022-06-30 NOTE — H&P (Signed)
Evelyn Lewis is an 51 y.o. female.    Chief Complaint:  Nasal congestion, nasal polyp  HPI: Patient presents today for planned elective procedure.  She denies any interval change in history since office visit on 06/02/2022:  Last visit 05/18/2022: Evelyn Lewis is a 51 y.o. female who presents as a new consult, referred by Ollen Bowl, MD, for evaluation and treatment of nasal obstruction. Patient states that for the last several years, she has experienced worse nasal congestion in the right nostril. She states that a couple of years ago, she felt as though she could not get any airflow through the right nostril at all. Recently, she states that she blew her nose strongly, and tissue that appeared to be polypoid came out. She was seen by her PCP, who did not note any significant abnormality on physical exam, and recommended further evaluation with ENT. Patient denies significant allergy symptoms, although she does suspect seasonal and perennial allergies. She has never been formally allergy tested and does not take any allergy medications routinely. She denies history of sinusitis, or recent treatment with oral antibiotics. She has not had any dedicated imaging of her head or sinuses. She denies anosmia, hypogeusia, pain, facial pressure. She states that occasionally, she does experience mild headaches.   Past Medical History:  Diagnosis Date   Anxiety about health    Family history of adverse reaction to anesthesia    father woke up during colonoscopy   Gastro-esophageal reflux disease without esophagitis    Occasionally   Palpitations    Sinus pressure    Sleep disturbance    "snorts" at night, and has vivid dreams - she has gained some weight and worries about sleep apnea - she says her family has told her she doesn't have apneic episodes, and she says she's fatigued, but not sleepy.   Tick bite 12/05/2013   Back of neck   URI (upper respiratory infection) 12/17/2013   Vitamin D  deficiency     Past Surgical History:  Procedure Laterality Date   BREAST BIOPSY Right 02/18/2014   COLONOSCOPY     WISDOM TOOTH EXTRACTION      Family History  Problem Relation Age of Onset   Heart attack Maternal Grandfather    Colon cancer Paternal Grandfather    Breast cancer Neg Hx     Social History:  reports that she has never smoked. She has never used smokeless tobacco. She reports that she does not drink alcohol and does not use drugs.  Allergies:  Allergies  Allergen Reactions   Cephalexin Rash    Happened years ago, unsure of severity level   Neomycin Sulfate [Neomycin] Rash   Sulfa Antibiotics Other (See Comments)    UNKNOWN REACTION    Medications Prior to Admission  Medication Sig Dispense Refill   acetaminophen (TYLENOL) 500 MG tablet Take 500 mg by mouth every 6 (six) hours as needed for moderate pain.     calcium carbonate (TUMS - DOSED IN MG ELEMENTAL CALCIUM) 500 MG chewable tablet Chew 1 tablet by mouth 2 (two) times daily as needed for indigestion or heartburn.     carboxymethylcellulose (REFRESH TEARS) 0.5 % SOLN Place 1 drop into both eyes 2 (two) times daily.     desonide (DESOWEN) 0.05 % ointment Apply 1 Application topically 2 (two) times daily as needed (eyelid dermatitis).     famotidine (PEPCID) 20 MG tablet Take 20 mg by mouth daily as needed for heartburn or indigestion.  PRESCRIPTION MEDICATION Apply 1 Application topically daily. Metronidazole + Ivermectin + salicylic acid compounded cream     sodium chloride (OCEAN) 0.65 % SOLN nasal spray Place 1 spray into both nostrils 2 (two) times daily.     Vitamin D, Ergocalciferol, (DRISDOL) 1.25 MG (50000 UNIT) CAPS capsule Take 50,000 Units by mouth every Tuesday.     diltiazem (CARDIZEM) 30 MG tablet Take 1 tablet (30 mg total) by mouth as needed (Palpitations). 90 tablet 1    Results for orders placed or performed during the hospital encounter of 06/30/22 (from the past 48 hour(s))  CBC per  protocol     Status: None   Collection Time: 06/30/22 11:53 AM  Result Value Ref Range   WBC 6.2 4.0 - 10.5 K/uL   RBC 5.04 3.87 - 5.11 MIL/uL   Hemoglobin 13.8 12.0 - 15.0 g/dL   HCT 37.9 02.4 - 09.7 %   MCV 85.1 80.0 - 100.0 fL   MCH 27.4 26.0 - 34.0 pg   MCHC 32.2 30.0 - 36.0 g/dL   RDW 35.3 29.9 - 24.2 %   Platelets 206 150 - 400 K/uL   nRBC 0.0 0.0 - 0.2 %    Comment: Performed at [facility], [address], [place], [place] [address]  Pregnancy, urine POC     Status: None   Collection Time: 06/30/22 12:01 PM  Result Value Ref Range   Preg Test, Ur NEGATIVE NEGATIVE    Comment:        THE SENSITIVITY OF THIS METHODOLOGY IS >24 mIU/mL    No results found.  ROS: ROS  Blood pressure (!) 140/88, pulse 100, temperature 97.9 F (36.6 C), temperature source Oral, resp. rate 18, height 5\' 4"  (1.626 m), weight 79.4 kg, last menstrual period 05/30/2022, SpO2 98 %.  PHYSICAL EXAM: Physical Exam Constitutional:      Appearance: Normal appearance.  HENT:     Right Ear: External ear normal.     Left Ear: External ear normal.     Mouth/Throat:     Mouth: Mucous membranes are moist.  Pulmonary:     Effort: Pulmonary effort is normal.  Neurological:     General: No focal deficit present.     Mental Status: She is alert.  Psychiatric:        Mood and Affect: Mood normal.        Behavior: Behavior normal.     Studies Reviewed: CT sinus reviewed, demonstrates complete opacification of the right maxillary sinus, with antrochoanal polyp extending from maxillary sinus into the nasopharynx. Remaining paranasal sinuses appear well aerated, with no evidence of other polypoid tissue or mucosal abnormality. Bilateral inferior turbinate hypertrophy noted. Nasal septum is midline.   Assessment/Plan [name] is a 51 y.o. female with several year history of right sided nasal congestion, with no other associated symptoms. 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 CT sinus . No evidence of involvement of other paranasal sinuses. Based on history, physical exam and imaging, I recommend consideration of functional endoscopic sinus surgery with right maxillary antrostomy with tissue removal, polypectomy and bilateral inferior turbinate reduction. Risks, recovery, postop restrictions reviewed. All questions answered.     Layton Tappan A Elenore Wanninger 06/30/2022, 1:53 PM

## 2022-06-30 NOTE — Op Note (Signed)
OPERATIVE NOTE  Ji Fairburn Date/Time of Admission: 06/30/2022 11:32 AM  CSN: [id number];MRN:[id number] Attending Provider: Cheron Schaumann A, DO Room/Bed: MCPO/NONE DOB: 1970/07/30 Age: 51 y.o.   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  Procedure: Procedure(s): FUNCTIONAL ENDOSCOPIC SINUS SURGERY WITH IM AGUE GUIDANCE, RIGHT MAXILLARY ANTROSTOMY WITH TISSUE REMOVAL, POLYPECTOMY AND BILATERAL INFERIOR TURBINATE REDUCTION  Anesthesia: General  Surgeon(s): Azana Kiesler A Jenson Beedle, DO  Staff: Circulator: Virgel Bouquet, RN Scrub Person: Ivin Booty, RN; Coralee North T Vendor Representative : Alene Mires  Implants: * No implants in log *  Specimens: ID Type Source Tests Collected by Time Destination  1 : Right sinus contents Tissue PATH ENT excision SURGICAL PATHOLOGY Breshay Ilg A, DO 06/30/2022 1528     Complications: None  EBL: 25 ML  Condition: stable  Operative Findings:  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 The patient was turned over to the ENT service, at which time the image-guided system was attached and noted to be in good calibration. Lidocaine 1% with 1:100,000 epinephrine was injected into the inferior turbinates bilaterally, the right middle turbinate, and the axilla between the medial turbinate and the lateral nasal wall. Afrin-soaked pledgets were placed into the nasal cavity, and the patient was prepped and draped in sterile fashion. Attention turned to the right-sided sinonasal cavity. A large antrochoanal polyp  with noted to be filling the entirety of the left nasal cavity. This was removed partially using Blakesley forceps until normal anatomic landmarks were easily visualized. The middle turbinate was medialized and a ball-tipped seeker was used to slightly anterior fracture the uncinate process. The uncinate process was completely fractured anteriorly and then removed with a combination of backbiter and a microdebrider. A ball-tipped seeker was used to identify the natural os of the maxillary sinus, and it was widened with combination of the backbiter, the microdebrider, the olive tip suction, and the straight TruCut until it was widely patent. Additional polyp tissue was removed using the shaver. A 60 degree shaver blade was then used to remove the remaining polyp tissue more anteriorly. Following this, inspection with a 45 degree Hopkins rods demonstrated no significant residual polyp tissue in the sinus. Attention was then turned to the inferior turbinates bilaterally. They were outfractured and then submucous resection was performed by making an incision in the leading edge with a 15 blade, separating the mucosa from bone with a Cottle elevator and then using the micro debrider via a turbinate blade to remove bone. Epinephrine soaked pledgets were placed in the right sinonasal cavity.  Pledgets were removed, copious irrigation was placed in the sinonasal cavity and Arista, followed by Posisep absorbable nasal pack was placed lateral to the middle turbinate in the axilla between it and the lateral nasal wall on the right. An orogastric tube was placed and the stomach cavity was suctioned to reduce postoperative nausea. The patient was turned over to anesthesia service and was extubated in the operating room and transferred to the PACU in stable condition.    Laren Boom, DO [facility]NT  06/30/2022

## 2022-06-30 NOTE — Anesthesia Procedure Notes (Signed)
Procedure Name: Intubation Date/Time: 06/30/2022 2:39 PM  Performed by: Darletta Moll, CRNAPre-anesthesia Checklist: Patient identified, Emergency Drugs available, Suction available and Patient being monitored Patient Re-evaluated:Patient Re-evaluated prior to induction Oxygen Delivery Method: Circle system utilized Preoxygenation: Pre-oxygenation with 100% oxygen Induction Type: IV induction Ventilation: Mask ventilation without difficulty Laryngoscope Size: Mac and 3 Grade View: Grade I Tube type: Oral Tube size: 7.0 mm Number of attempts: 1 Airway Equipment and Method: Stylet Placement Confirmation: ETT inserted through vocal cords under direct vision, positive ETCO2 and breath sounds checked- equal and bilateral Secured at: 21 cm Tube secured with: Tape Dental Injury: Teeth and Oropharynx as per pre-operative assessment

## 2022-06-30 NOTE — Anesthesia Preprocedure Evaluation (Addendum)
Anesthesia Evaluation  Patient identified by MRN, date of birth, ID band Patient awake    Reviewed: Allergy & Precautions, NPO status , Patient's Chart, lab work & pertinent test results  Airway Mallampati: II  TM Distance: >3 FB Neck ROM: Full    Dental  (+) Teeth Intact, Dental Advisory Given   Pulmonary neg pulmonary ROS   breath sounds clear to auscultation       Cardiovascular  Rhythm:Regular Rate:Tachycardia     Neuro/Psych   Anxiety     negative neurological ROS     GI/Hepatic Neg liver ROS,GERD  Medicated,,  Endo/Other  negative endocrine ROS    Renal/GU negative Renal ROS     Musculoskeletal negative musculoskeletal ROS (+)    Abdominal   Peds  Hematology negative hematology ROS (+)   Anesthesia Other Findings   Reproductive/Obstetrics                             Anesthesia Physical Anesthesia Plan  ASA: 2  Anesthesia Plan: General   Post-op Pain Management: Tylenol PO (pre-op)*   Induction: Intravenous  PONV Risk Score and Plan: 4 or greater and Ondansetron, Dexamethasone, Midazolam and Scopolamine patch - Pre-op  Airway Management Planned: Oral ETT  Additional Equipment: None  Intra-op Plan:   Post-operative Plan: Extubation in OR  Informed Consent: I have reviewed the patients History and Physical, chart, labs and discussed the procedure including the risks, benefits and alternatives for the proposed anesthesia with the patient or authorized representative who has indicated his/her understanding and acceptance.     Dental advisory given  Plan Discussed with: CRNA  Anesthesia Plan Comments:        Anesthesia Quick Evaluation

## 2022-06-30 NOTE — Transfer of Care (Signed)
Immediate Anesthesia Transfer of Care Note  Patient: Evelyn Lewis  Procedure(s) Performed: FUNCTIONAL ENDOSCOPIC SINUS SURGERY WITH RIGHT MAXILLARY ANTROSTOMY WITH TISSUE REMOVAL, POLYPECTOMY AND BILATERAL INFERIOR TURBINATE REDUCTION (Bilateral: Nose)  Patient Location: PACU  Anesthesia Type:General  Level of Consciousness: drowsy and patient cooperative  Airway & Oxygen Therapy: Patient Spontanous Breathing and Patient connected to face mask oxygen  Post-op Assessment: Report given to RN, Post -op Vital signs reviewed and stable, and Patient moving all extremities X 4  Post vital signs: Reviewed and stable  Last Vitals:  Vitals Value Taken Time  BP 89/60 06/30/22 1545  Temp    Pulse 99 06/30/22 1549  Resp 13 06/30/22 1549  SpO2 98 % 06/30/22 1549  Vitals shown include unvalidated device data.  Last Pain:  Vitals:   06/30/22 1207  TempSrc:   PainSc: 0-No pain      Patients Stated Pain Goal: 0 (06/30/22 1207)  Complications: No notable events documented. Dr. Hart Rochester aware of BP, no treatment at this time.

## 2022-06-30 NOTE — Anesthesia Postprocedure Evaluation (Signed)
Anesthesia Post Note  Patient: Evelyn Lewis  Procedure(s) Performed: FUNCTIONAL ENDOSCOPIC SINUS SURGERY WITH RIGHT MAXILLARY ANTROSTOMY WITH TISSUE REMOVAL, POLYPECTOMY AND BILATERAL INFERIOR TURBINATE REDUCTION (Bilateral: Nose)     Patient location during evaluation: PACU Anesthesia Type: General Level of consciousness: awake and alert Pain management: pain level controlled Vital Signs Assessment: post-procedure vital signs reviewed and stable Respiratory status: spontaneous breathing, nonlabored ventilation, respiratory function stable and patient connected to nasal cannula oxygen Cardiovascular status: blood pressure returned to baseline and stable Postop Assessment: no apparent nausea or vomiting Anesthetic complications: no   No notable events documented.  Last Vitals:  Vitals:   06/30/22 1615 06/30/22 1630  BP: 102/70 93/76  Pulse: 87 89  Resp: 15 14  Temp: 36.8 C   SpO2: 95% 95%    Last Pain:  Vitals:   06/30/22 1615  TempSrc:   PainSc: 0-No pain                 Shelton Silvas

## 2022-06-30 NOTE — Discharge Instructions (Signed)
[facility] S[facility]US [facility] ([facility]) Post Operative Instructions  Office: [phone]  The Surgery Itself Endoscopic sinus surgery (with or without septoplasty and turbinate reduction) involves general anesthesia, typically for one to two hours.  Patients may be sedated for several hours after surgery and may remain sleepy for the better part of the day. Nausea and vomiting are occasionally seen, and usually resolve by the evening of surgery - even without additional medications. Almost all patients can go home the day of surgery.  After Surgery  Facial pressure and fullness similar to a sinus infection/headache is normal after surgery. Breathing through your nose is also difficult due to swelling. A humidifier or vaporizer can be used in the bedroom to prevent throat pain with mouth breathing.   Bloody nasal drainage is normal after this surgery for 5-7 days, usually decreasing in volume with each day that passes. Drainage will flow from the front of the nose and down the back of the throat. Make sure you spit out blood drainage that drips down the back of your throat to prevent nausea/vomiting. You will have a nasal drip pad/sling with gauze to catch drainage from the front of your nose. The dressing may need to be changed frequently during the first 24 hours following surgery. In case of profuse nasal bleeding, you may apply ice to the bridge of the nose and pinch the nose just above the tip and hold for 10 minutes; if bleeding continues, contact the doctors office.   Frequent hot showers or saline nasal rinses (NeilMed) will help break up congestion and clear any clot or mucus that builds up within the nose after surgery. This can be started the day after surgery.   It is more comfortable to sleep with extra pillows or in a recliner for the first few days after surgery until the drainage begins to resolve.    Do not blow your nose for 2 weeks after surgery.   Avoid lifting > 10 lbs. and no  vigorous exercise for 2 weeks after Surgery.   Avoid airplane travel for 2 weeks following sinus surgery; the cabin pressure changes can cause pain and swelling within the nose/sinuses.   Sense of smell and taste are often diminished for several weeks after surgery. There may be some tenderness or numbness in your upper front teeth, which is normal after surgery. You may express old clot, discolored mucus or very large nasal crusts from your nose for up to 3-4 weeks after surgery; depending on how frequently and how effectively you irrigate your nose with the saltwater spray.   You may have absorbable sutures inside of your nose after surgery that will slowly dissolve in 2-3 weeks. Be careful when clearing crusts from the nose since they may be attached to these sutures.  Medications  Pain medication can be used for pain as prescribed. Pain and pressure in the nose is expected after surgery. As the surgical site heals, pain will resolve over the course of a week. Pain medications can cause nausea, which can be prevented if you take them with food or milk.   You may be given an antibiotic for one week after surgery to prevent infection. Take this medication with food to prevent nausea or vomiting.   You can use 2 nasal sprays after surgery: Afrin can be used up to 2 times a day for up to 5 days after surgery (best before bed) to reduce bloody drainage from the nose for the first few days after surgery.  Saline/salt water spray can be used as often as you would like starting the day after surgery to prevent crusting inside of the nose. It is recommended to use saline at least 3-4 times per day.   Take all of your routine medications as prescribed, unless told otherwise by your surgeon. Any medications that thin the blood should be avoided. This includes aspirin. Avoid aspirin-like products for the first 72 hours after surgery (Advil, Motrin, Excedrin, Alieve, Celebrex, Naprosyn), but you may use them as  needed for pain after 72 hours.

## 2022-06-30 NOTE — Progress Notes (Signed)
Today's EKG reviewed by Dr. Hart Rochester.

## 2022-07-01 ENCOUNTER — Encounter (HOSPITAL_COMMUNITY): Payer: Self-pay | Admitting: Otolaryngology

## 2022-07-02 LAB — SURGICAL PATHOLOGY

## 2022-10-14 NOTE — Progress Notes (Signed)
Cardiology Office Note Date:  10/14/2022  Patient ID:  Evelyn Lewis, Evelyn Lewis 12-10-1970, MRN [id number] PCP:  Darliss Cheney, MD  Cardiologist:  Dr. Lovena Le   Chief Complaint:  annual/periodic visit   History of Present Illness: Evelyn Lewis is a 52 y.o. female with history of GERD, anxiety, ?apnea, PVCs/PACs   She saw Dr. Lovena Le 10/30/20, her 1st visit since 2018, reported occ palpitations, discussed benign nature of her PACs/PVCs, advised lifestyle modifications and weight loss.  She saw Jonni Sanger a couple times in 2022, last was 05/26/21, increased palpitations, several times weekly, no changes to her medicines, again advised avoidance of potential triggers.   TODAY She is doing well She is a Pharmacist, hospital, on her feet much of the day, does walk some, though no formal/regular exercise She has good exertional capacity, no difficulties with ADLs No CP, SOB No near syncope or syncope.  Infrequently she will have an awareness of her heart beating fast, not symptomatic, but can tell it's fast, usually will take some deep breaths, "walk it off" for a couple minutes and it settles. No clear trigger.  She see her PMD who is watching her cholesterol Has grandparents with CAD, both her parents are alive 52's with not known CAD   Past Medical History:  Diagnosis Date   Anxiety about health    Family history of adverse reaction to anesthesia    father woke up during colonoscopy   Gastro-esophageal reflux disease without esophagitis    Occasionally   Palpitations    Sinus pressure    Sleep disturbance    "snorts" at night, and has vivid dreams - she has gained some weight and worries about sleep apnea - she says her family has told her she doesn't have apneic episodes, and she says she's fatigued, but not sleepy.   Tick bite 12/05/2013   Back of neck   URI (upper respiratory infection) 12/17/2013   Vitamin D deficiency     Past Surgical History:  Procedure Laterality Date   BREAST  BIOPSY Right 02/18/2014   COLONOSCOPY     SINUS ENDO WITH FUSION Bilateral 06/30/2022   Procedure: FUNCTIONAL ENDOSCOPIC SINUS SURGERY WITH RIGHT MAXILLARY ANTROSTOMY WITH TISSUE REMOVAL, POLYPECTOMY AND BILATERAL INFERIOR TURBINATE REDUCTION;  Surgeon: Jason Coop, DO;  Location: [facility];  Service: ENT;  Laterality: Bilateral;   WISDOM TOOTH EXTRACTION      Current Outpatient Medications  Medication Sig Dispense Refill   acetaminophen (TYLENOL) 500 MG tablet Take 500 mg by mouth every 6 (six) hours as needed for moderate pain.     calcium carbonate (TUMS - DOSED IN MG ELEMENTAL CALCIUM) 500 MG chewable tablet Chew 1 tablet by mouth 2 (two) times daily as needed for indigestion or heartburn.     carboxymethylcellulose (REFRESH TEARS) 0.5 % SOLN Place 1 drop into both eyes 2 (two) times daily.     desonide (DESOWEN) 0.05 % ointment Apply 1 Application topically 2 (two) times daily as needed (eyelid dermatitis).     diltiazem (CARDIZEM) 30 MG tablet Take 1 tablet (30 mg total) by mouth as needed (Palpitations). 90 tablet 1   famotidine (PEPCID) 20 MG tablet Take 20 mg by mouth daily as needed for heartburn or indigestion.     PRESCRIPTION MEDICATION Apply 1 Application topically daily. Metronidazole + Ivermectin + salicylic acid compounded cream     sodium chloride (OCEAN) 0.65 % SOLN nasal spray Place 1 spray into both nostrils 2 (two) times daily.  Vitamin D, Ergocalciferol, (DRISDOL) 1.25 MG (50000 UNIT) CAPS capsule Take 50,000 Units by mouth every Tuesday.     No current facility-administered medications for this visit.    Allergies:   Cephalexin, Neomycin sulfate [neomycin], and Sulfa antibiotics   Social History:  The patient  reports that she has never smoked. She has never used smokeless tobacco. She reports that she does not drink alcohol and does not use drugs.   Family History:  The patient's family history includes Colon cancer in her paternal grandfather; Heart attack  in her maternal grandfather.  ROS:  Please see the history of present illness.    All other systems are reviewed and otherwise negative.   PHYSICAL EXAM: VS:  There were no vitals taken for this visit. BMI: There is no height or weight on file to calculate BMI. Well nourished, well developed, in no acute distress  HEENT: normocephalic, atraumatic  Neck: no JVD, carotid bruits or masses Cardiac: RRR; no significant murmurs, no rubs, or gallops Lungs: CTA b/l, no wheezing, rhonchi or rales  Abd: soft, nontender, obese MS: no deformity or atrophy Ext: no edema  Skin: warm and dry, no rash Neuro:  No gross deficits appreciated Psych: euthymic mood, full affect   EKG:  done 06/30/22, personally reviewed ST 104bpm, LAD   01/06/17: TTE Study Conclusions - Left ventricle: The cavity size was normal. Wall thickness was   normal. The estimated ejection fraction was 55%. Wall motion was   normal; there were no regional wall motion abnormalities. Doppler   parameters are consistent with abnormal left ventricular   relaxation (grade 1 diastolic dysfunction). - Aortic valve: There was no stenosis. - Mitral valve: There was no regurgitation. - Right ventricle: The cavity size was normal. Systolic function   was normal. - Pulmonary arteries: No complete TR doppler jet so unable to   estimate PA systolic pressure. - Inferior vena cava: The vessel was normal in size. The   respirophasic diameter changes were in the normal range (>= 50%),   consistent with normal central venous pressure. Impressions: - Normal LV size with EF 55%. Normal RV size and systolic function.   No significant valvular abnormalities.  June 2018: Monitor 1. NSR with sinus tachycardia and sinus bradycardia 2. Rare PVC's and PAC's 3. Rare episodes of NS atrial tachycardia at 155/min, lasting seconds 4. No significant pauses    Recent Labs: 06/30/2022: Hemoglobin 13.8; Platelets 206  No results found for  requested labs within last 365 days.   CrCl cannot be calculated (Patient's most recent lab result is older than the maximum 21 days allowed.).   Wt Readings from Last 3 Encounters:  06/30/22 175 lb (79.4 kg)  05/26/21 177 lb (80.3 kg)  01/20/21 177 lb (80.3 kg)     Other studies reviewed: Additional studies/records reviewed today include: summarized above  ASSESSMENT AND PLAN:  1. Palpitations 2. PACs 3. PVCs 4. Remote monitor with some ST/AT Infrequent and minimally sym symptomatic if at all faster rates   Discussed exercise, cardiac/conditioning Avoiding stimulants Staying well hydrated  4. High BP Recheck is 136/80 Typically better at other offices, she thinks a bit of white coat syndrome Advised minimizing sodium, exercise, weight loss, checking her BP at home, following up with her PMD  Reviewed heart healthy lifestyle, her PMD is watching her lipids    Disposition: we can continue to see her PRN, annually.   Current medicines are reviewed at length with the patient today.  The patient did  not have any concerns regarding medicines.  Haywood Lasso, PA-C 10/14/2022 12:21 PM     [address] [place] [id number] [phone] (office)  [phone] (fax)

## 2022-10-15 ENCOUNTER — Encounter: Payer: Self-pay | Admitting: Physician Assistant

## 2022-10-15 ENCOUNTER — Ambulatory Visit: Payer: BC Managed Care – PPO | Attending: Physician Assistant | Admitting: Physician Assistant

## 2022-10-15 VITALS — BP 130/86 | HR 97 | Ht 65.0 in | Wt 188.6 lb

## 2022-10-15 DIAGNOSIS — R002 Palpitations: Secondary | ICD-10-CM | POA: Diagnosis not present

## 2022-10-15 DIAGNOSIS — I491 Atrial premature depolarization: Secondary | ICD-10-CM

## 2022-10-15 DIAGNOSIS — I493 Ventricular premature depolarization: Secondary | ICD-10-CM

## 2022-10-15 NOTE — Patient Instructions (Addendum)
Medication Instructions:    Your physician recommends that you continue on your current medications as directed. Please refer to the Current Medication list given to you today.   *If you need a refill on your cardiac medications before your next appointment, please call your pharmacy*   Lab Work: NONE ORDERED  TODAY    If you have labs (blood work) drawn today and your tests are completely normal, you will receive your results only by: MyChart Message (if you have MyChart) OR A paper copy in the mail If you have any lab test that is abnormal or we need to change your treatment, we will call you to review the results.   Testing/Procedures: NONE ORDERED  TODAY   Follow-Up: At [facility], you and your health needs are our priority.  As part of our continuing mission to provide you with exceptional heart care, we have created designated Provider Care Teams.  These Care Teams include your primary Cardiologist (physician) and Advanced Practice Providers (APPs -  Physician Assistants and Nurse Practitioners) who all work together to provide you with the care you need, when you need it.  We recommend signing up for the patient portal called "MyChart".  Sign up information is provided on this After Visit Summary.  MyChart is used to connect with patients for Virtual Visits (Telemedicine).  Patients are able to view lab/test results, encounter notes, upcoming appointments, etc.  Non-urgent messages can be sent to your provider as well.   To learn more about what you can do with MyChart, go to [email or web address].    Your next appointment:  CONTACT [facility] [phone] AS NEEDED FOR  ANY CARDIAC RELATED SYMPTOMS   Other Instructions  

## 2022-12-29 ENCOUNTER — Other Ambulatory Visit: Payer: Self-pay | Admitting: Internal Medicine

## 2022-12-29 DIAGNOSIS — R921 Mammographic calcification found on diagnostic imaging of breast: Secondary | ICD-10-CM

## 2023-04-13 ENCOUNTER — Other Ambulatory Visit: Payer: Self-pay | Admitting: Internal Medicine

## 2023-04-13 DIAGNOSIS — N6342 Unspecified lump in left breast, subareolar: Secondary | ICD-10-CM

## 2023-04-28 ENCOUNTER — Ambulatory Visit
Admission: RE | Admit: 2023-04-28 | Discharge: 2023-04-28 | Disposition: A | Discharge status: Home / Self Care | Payer: BC Managed Care – PPO | Source: Ambulatory Visit | Attending: Internal Medicine | Admitting: Internal Medicine

## 2023-04-28 DIAGNOSIS — N6342 Unspecified lump in left breast, subareolar: Secondary | ICD-10-CM

## 2023-06-21 ENCOUNTER — Ambulatory Visit
Admission: RE | Admit: 2023-06-21 | Discharge: 2023-06-21 | Disposition: A | Discharge status: Home / Self Care | Payer: BC Managed Care – PPO | Source: Ambulatory Visit | Attending: Internal Medicine | Admitting: Internal Medicine

## 2023-06-21 DIAGNOSIS — R921 Mammographic calcification found on diagnostic imaging of breast: Secondary | ICD-10-CM

## 2024-04-03 ENCOUNTER — Other Ambulatory Visit: Payer: Self-pay | Admitting: Obstetrics and Gynecology

## 2024-04-03 DIAGNOSIS — Z1231 Encounter for screening mammogram for malignant neoplasm of breast: Secondary | ICD-10-CM

## 2024-06-22 ENCOUNTER — Ambulatory Visit

## 2024-07-20 ENCOUNTER — Ambulatory Visit
Admission: RE | Admit: 2024-07-20 | Discharge: 2024-07-20 | Disposition: A | Discharge status: Home / Self Care | Source: Ambulatory Visit | Attending: Obstetrics and Gynecology | Admitting: Obstetrics and Gynecology

## 2024-07-20 DIAGNOSIS — Z1231 Encounter for screening mammogram for malignant neoplasm of breast: Secondary | ICD-10-CM
